# Patient Record
Sex: Female | Born: 1985 | Race: White | Hispanic: Yes | Marital: Married | Smoking: Never smoker
Health system: Southern US, Community
[De-identification: ages and names within clinical notes are randomized; demographics above are authoritative.]

## PROBLEM LIST (undated history)

## (undated) DIAGNOSIS — A64 Unspecified sexually transmitted disease: Secondary | ICD-10-CM

## (undated) DIAGNOSIS — IMO0002 Reserved for concepts with insufficient information to code with codable children: Secondary | ICD-10-CM

## (undated) DIAGNOSIS — R87629 Unspecified abnormal cytological findings in specimens from vagina: Secondary | ICD-10-CM

## (undated) HISTORY — DX: Unspecified sexually transmitted disease: A64

## (undated) HISTORY — DX: Unspecified abnormal cytological findings in specimens from vagina: R87.629

## (undated) HISTORY — DX: Reserved for concepts with insufficient information to code with codable children: IMO0002

---

## 2009-06-05 DIAGNOSIS — A64 Unspecified sexually transmitted disease: Secondary | ICD-10-CM

## 2009-06-05 HISTORY — DX: Unspecified sexually transmitted disease: A64

## 2009-07-04 DIAGNOSIS — R87619 Unspecified abnormal cytological findings in specimens from cervix uteri: Secondary | ICD-10-CM

## 2009-07-04 DIAGNOSIS — IMO0002 Reserved for concepts with insufficient information to code with codable children: Secondary | ICD-10-CM

## 2009-07-04 HISTORY — DX: Reserved for concepts with insufficient information to code with codable children: IMO0002

## 2009-07-04 HISTORY — DX: Unspecified abnormal cytological findings in specimens from cervix uteri: R87.619

## 2009-07-06 HISTORY — PX: COLPOSCOPY: SHX161

## 2012-09-10 ENCOUNTER — Encounter: Payer: Self-pay | Admitting: Nurse Practitioner

## 2012-09-10 ENCOUNTER — Ambulatory Visit (INDEPENDENT_AMBULATORY_CARE_PROVIDER_SITE_OTHER): Payer: BC Managed Care – PPO | Admitting: Nurse Practitioner

## 2012-09-10 VITALS — BP 110/70 | HR 80 | Resp 20 | Ht 64.0 in | Wt 159.0 lb

## 2012-09-10 DIAGNOSIS — Z01419 Encounter for gynecological examination (general) (routine) without abnormal findings: Secondary | ICD-10-CM

## 2012-09-10 DIAGNOSIS — Z0000 Encounter for general adult medical examination without abnormal findings: Secondary | ICD-10-CM

## 2012-09-10 LAB — POCT URINALYSIS DIPSTICK
Ketones, UA: NEGATIVE
Leukocytes, UA: NEGATIVE
Nitrite, UA: NEGATIVE
Protein, UA: NEGATIVE
Urobilinogen, UA: NEGATIVE

## 2012-09-10 LAB — HEMOGLOBIN, FINGERSTICK: Hemoglobin, fingerstick: 13.5 g/dL (ref 12.0–16.0)

## 2012-09-10 MED ORDER — LEVONORGESTREL-ETHINYL ESTRAD 0.15-30 MG-MCG PO TABS: 1.0000 | ORAL_TABLET | Freq: Every day | ORAL | Status: DC

## 2012-09-10 NOTE — Progress Notes (Signed)
Patient ID: Tonya Douglas, female   DOB: 12-07-1985, 27 y.o.   MRN: [id number] 27 y.o. G0. Single Hispanic Fe here for annual exam.  No new health problems.  Same partner for about a year.  They are going to [place] next month.  Patient's last menstrual period was 09/01/2012.          Sexually active: yes  The current method of family planning is OCP (estrogen/progesterone).    Exercising: yes  Gym/ health club routine includes cardio and light weights. Smoker:  no  Health Maintenance: Pap: 02/28/12 neg + HPV TDaP:  Less than 10 years (? 2008) Gardasil completed: 09/11/11 Labs: Hgb 13.5    U/A Neg   reports that she has never smoked. She has never used smokeless tobacco. She reports that she drinks about 1.5 ounces of alcohol per week. She reports that she does not use illicit drugs.  Past Medical History  Diagnosis Date  . 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 Abnormal Pap smear 02/2010    LSIL, COLPO Neg  . STD (sexually transmitted disease) 06/2009    + chlam   . STD (sexually transmitted disease) 03/29/11    + HSV II, + C&S vulva  . 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 No pertinent past surgical history.  Current Outpatient Prescriptions  Medication Sig Dispense Refill  . ALTAVERA 0.15-30 MG-MCG tablet       . FOLIC ACID PO Take by mouth 4 (four) times a week.      . Multiple Vitamin (MULTI-VITAMIN PO) Take by mouth 4 (four) times a week.       No current facility-administered medications for this visit.    History reviewed. No pertinent family history.  ROS:  Pertinent items are noted in HPI.  Otherwise, a comprehensive ROS was negative.  Exam:   BP 110/70  Pulse 80  Resp 20  Ht 5\' 4"  (1.626 m)  Wt 159 lb (72.122 kg)  BMI 27.28 kg/m2  LMP 09/01/2012 Height: 5\' 4"  (162.6 cm)  Ht Readings from Last 3 Encounters:  09/10/12 5\' 4"  (1.626 m)    General appearance: alert,  cooperative and appears stated age Head: Normocephalic, without obvious abnormality, atraumatic Neck: no adenopathy, supple, symmetrical, trachea midline and thyroid normal to inspection and palpation Lungs: clear to auscultation bilaterally Breasts: normal appearance, no masses or tenderness Heart: regular rate and rhythm Abdomen: soft, non-tender; no masses,  no organomegaly Extremities: extremities normal, atraumatic, no cyanosis or edema Skin: Skin color, texture, turgor normal. No rashes or lesions Lymph nodes: Cervical, supraclavicular, and axillary nodes normal. No abnormal inguinal nodes palpated Neurologic: Grossly normal   Pelvic: External genitalia:  no lesions              Urethra:  normal appearing urethra with no masses, tenderness or lesions              Bartholin's and Skene's: normal                 Vagina: normal appearing vagina with normal color and discharge, no lesions              Cervix: anteverted              Pap taken: yes Bimanual Exam:  Uterus:  normal size, contour, position, consistency, mobility, non-tender  Adnexa: no mass, fullness, tenderness               Rectovaginal: Confirms               Anus:  normal sphincter tone, no lesions  A:  Well Woman with normal exam  OCP for contraception  History of LGSIL with HR HPV and Colpo Biopsy 02/2010 & 03/2011  P:   Pap smear as per guidelines - if normal plan on repeat in 1 year  Refill OCP for 1 year  Counseled on breast self exam, adequate intake of calcium and vitamin D, diet and exercise return annually or prn  An After Visit Summary was printed and given to the patient.

## 2012-09-10 NOTE — Patient Instructions (Signed)
General topics  Next pap or exam is  due in 1 year Take a Women's multivitamin Take 1200 mg. of calcium daily - prefer dietary If any concerns in interim to call back  Breast Self-Awareness Practicing breast self-awareness may pick up problems early, prevent significant medical complications, and possibly save your life. By practicing breast self-awareness, you can become familiar with how your breasts look and feel and if your breasts are changing. This allows you to notice changes early. It can also offer you some reassurance that your breast health is good. One way to learn what is normal for your breasts and whether your breasts are changing is to do a breast self-exam. If you find a lump or something that was not present in the past, it is best to contact your caregiver right away. Other findings that should be evaluated by your caregiver include nipple discharge, especially if it is bloody; skin changes or reddening; areas where the skin seems to be pulled in (retracted); or new lumps and bumps. Breast pain is seldom associated with cancer (malignancy), but should also be evaluated by a caregiver. BREAST SELF-EXAM The best time to examine your breasts is 5 7 days after your menstrual period is over.  ExitCare Patient Information 2013 [employer].   Exercise to Stay Healthy Exercise helps you become and stay healthy. EXERCISE IDEAS AND TIPS Choose exercises that:  You enjoy.  Fit into your day. You do not need to exercise really hard to be healthy. You can do exercises at a slow or medium level and stay healthy. You can:  Stretch before and after working out.  Try yoga, Pilates, or tai chi.  Lift weights.  Walk fast, swim, jog, run, climb stairs, bicycle, dance, or rollerskate.  Take aerobic classes. Exercises that burn about 150 calories:  Running 1  miles in 15 minutes.  Playing volleyball for 45 to 60 minutes.  Washing and waxing a car for 45 to 60  minutes.  Playing touch football for 45 minutes.  Walking 1  miles in 35 minutes.  Pushing a stroller 1  miles in 30 minutes.  Playing basketball for 30 minutes.  Raking leaves for 30 minutes.  Bicycling 5 miles in 30 minutes.  Walking 2 miles in 30 minutes.  Dancing for 30 minutes.  Shoveling snow for 15 minutes.  Swimming laps for 20 minutes.  Walking up stairs for 15 minutes.  Bicycling 4 miles in 15 minutes.  Gardening for 30 to 45 minutes.  Jumping rope for 15 minutes.  Washing windows or floors for 45 to 60 minutes. Document Released: 02/24/2010 Document Revised: 04/16/2011 Document Reviewed: 02/24/2010 Exit[employer] Patient Information 2013 [employer].   Other topics ( that may be useful information):    Sexually Transmitted Disease Sexually transmitted disease (STD) refers to any infection that is passed from person to person during sexual activity. This may happen by way of saliva, semen, blood, vaginal mucus, or urine. Common STDs include:  Gonorrhea.  Chlamydia.  Syphilis.  HIV/AIDS.  Genital herpes.  Hepatitis B and C.  Trichomonas.  Human papillomavirus (HPV).  Pubic lice. CAUSES  An STD may be spread by bacteria, virus, or parasite. A person can get an STD by:  Sexual intercourse with an infected person.  Sharing sex toys with an infected person.  Sharing needles with an infected person.  Having intimate contact with the genitals, mouth, or rectal areas of an infected person. SYMPTOMS  Some people may not have any symptoms, but   they can still pass the infection to others. Different STDs have different symptoms. Symptoms include:  Painful or bloody urination.  Pain in the pelvis, abdomen, vagina, anus, throat, or eyes.  Skin rash, itching, irritation, growths, or sores (lesions). These usually occur in the genital or anal area.  Abnormal vaginal discharge.  Penile discharge in men.  Soft, flesh-colored skin growths in the  genital or anal area.  Fever.  Pain or bleeding during sexual intercourse.  Swollen glands in the groin area.  Yellow skin and eyes (jaundice). This is seen with hepatitis. DIAGNOSIS  To make a diagnosis, your caregiver may:  Take a medical history.  Perform a physical exam.  Take a specimen (culture) to be examined.  Examine a sample of discharge under a microscope.  Perform blood test TREATMENT   Chlamydia, gonorrhea, trichomonas, and syphilis can be cured with antibiotic medicine.  Genital herpes, hepatitis, and HIV can be treated, but not cured, with prescribed medicines. The medicines will lessen the symptoms.  Genital warts from HPV can be treated with medicine or by freezing, burning (electrocautery), or surgery. Warts may come back.  HPV is a virus and cannot be cured with medicine or surgery.However, abnormal areas may be followed very closely by your caregiver and may be removed from the cervix, vagina, or vulva through office procedures or surgery. If your diagnosis is confirmed, your recent sexual partners need treatment. This is true even if they are symptom-free or have a negative culture or evaluation. They should not have sex until their caregiver says it is okay. HOME CARE INSTRUCTIONS  All sexual partners should be informed, tested, and treated for all STDs.  Take your antibiotics as directed. Finish them even if you start to feel better.  Only take over-the-counter or prescription medicines for pain, discomfort, or fever as directed by your caregiver.  Rest.  Eat a balanced diet and drink enough fluids to keep your urine clear or pale yellow.  Do not have sex until treatment is completed and you have followed up with your caregiver. STDs should be checked after treatment.  Keep all follow-up appointments, Pap tests, and blood tests as directed by your caregiver.  Only use latex condoms and water-soluble lubricants during sexual activity. Do not use  petroleum jelly or oils.  Avoid alcohol and illegal drugs.  Get vaccinated for HPV and hepatitis. If you have not received these vaccines in the past, talk to your caregiver about whether one or both might be right for you.  Avoid risky sex practices that can break the skin. The only way to avoid getting an STD is to avoid all sexual activity.Latex condoms and dental dams (for oral sex) will help lessen the risk of getting an STD, but will not completely eliminate the risk. SEEK MEDICAL CARE IF:   You have a fever.  You have any new or worsening symptoms. Document Released: 04/14/2002 Document Revised: 04/16/2011 Document Reviewed: 04/21/2010 ExitCare Patient Information 2013 [employer].    Domestic Abuse You are being battered or abused if someone close to you hits, pushes, or physically hurts you in any way. You also are being abused if you are forced into activities. You are being sexually abused if you are forced to have sexual contact of any kind. You are being emotionally abused if you are made to feel worthless or if you are constantly threatened. It is important to remember that help is available. No one has the right to abuse you. PREVENTION OF FURTHER   ABUSE  Learn the warning signs of danger. This varies with situations but may include: the use of alcohol, threats, isolation from friends and family, or forced sexual contact. Leave if you feel that violence is going to occur.  If you are attacked or beaten, report it to the police so the abuse is documented. You do not have to press charges. The police can protect you while you or the attackers are leaving. Get the officer's name and badge number and a copy of the report.  Find someone you can trust and tell them what is happening to you: your caregiver, a nurse, clergy member, close friend or family member. Feeling ashamed is natural, but remember that you have done nothing wrong. No one deserves abuse. Document Released:  01/20/2000 Document Revised: 04/16/2011 Document Reviewed: 03/30/2010 ExitCare Patient Information 2013 [employer].    How Much is Too Much Alcohol? Drinking too much alcohol can cause injury, accidents, and health problems. These types of problems can include:   Car crashes.  Falls.  Family fighting (domestic violence).  Drowning.  Fights.  Injuries.  Burns.  Damage to certain organs.  Having a baby with birth defects. ONE DRINK CAN BE TOO MUCH WHEN YOU ARE:  Working.  Pregnant or breastfeeding.  Taking medicines. Ask your doctor.  Driving or planning to drive. If you or someone you know has a drinking problem, get help from a doctor.  Document Released: 11/18/2008 Document Revised: 04/16/2011 Document Reviewed: 11/18/2008 ExitCare Patient Information 2013 [employer].   Smoking Hazards Smoking cigarettes is extremely bad for your health. Tobacco smoke has over 200 known poisons in it. There are over 60 chemicals in tobacco smoke that cause cancer. Some of the chemicals found in cigarette smoke include:   Cyanide.  Benzene.  Formaldehyde.  Methanol (wood alcohol).  Acetylene (fuel used in welding torches).  Ammonia. Cigarette smoke also contains the poisonous gases nitrogen oxide and carbon monoxide.  Cigarette smokers have an increased risk of many serious medical problems and Smoking causes approximately:  90% of all lung cancer deaths in men.  80% of all lung cancer deaths in women.  90% of deaths from chronic obstructive lung disease. Compared with nonsmokers, smoking increases the risk of:  Coronary heart disease by 2 to 4 times.  Stroke by 2 to 4 times.  Men developing lung cancer by 23 times.  Women developing lung cancer by 13 times.  Dying from chronic obstructive lung diseases by 12 times.  . Smoking is the most preventable cause of death and disease in our society.  WHY IS SMOKING ADDICTIVE?  Nicotine is the chemical  agent in tobacco that is capable of causing addiction or dependence.  When you smoke and inhale, nicotine is absorbed rapidly into the bloodstream through your lungs. Nicotine absorbed through the lungs is capable of creating a powerful addiction. Both inhaled and non-inhaled nicotine may be addictive.  Addiction studies of cigarettes and spit tobacco show that addiction to nicotine occurs mainly during the teen years, when young people begin using tobacco products. WHAT ARE THE BENEFITS OF QUITTING?  There are many health benefits to quitting smoking.   Likelihood of developing cancer and heart disease decreases. Health improvements are seen almost immediately.  Blood pressure, pulse rate, and breathing patterns start returning to normal soon after quitting. QUITTING SMOKING   American Lung Association - 1-800-LUNGUSA  American Cancer Society - 1-800-ACS-2345 Document Released: 03/01/2004 Document Revised: 04/16/2011 Document Reviewed: 11/03/2008 ExitCare Patient Information 2013 [employer].   Stress Management Stress is a state of physical or mental tension that often results from changes in your life or normal routine. Some common causes of stress are:  Death of a loved one.  Injuries or severe illnesses.  Getting fired or changing jobs.  Moving into a new home. Other causes may be:  Sexual problems.  Business or financial losses.  Taking on a large debt.  Regular conflict with someone at home or at work.  Constant tiredness from lack of sleep. It is not just bad things that are stressful. It may be stressful to:  Win the lottery.  Get married.  Buy a new car. The amount of stress that can be easily tolerated varies from person to person. Changes generally cause stress, regardless of the types of change. Too much stress can affect your health. It may lead to physical or emotional problems. Too little stress (boredom) may also become stressful. SUGGESTIONS TO  REDUCE STRESS:  Talk things over with your family and friends. It often is helpful to share your concerns and worries. If you feel your problem is serious, you may want to get help from a professional counselor.  Consider your problems one at a time instead of lumping them all together. Trying to take care of everything at once may seem impossible. List all the things you need to do and then start with the most important one. Set a goal to accomplish 2 or 3 things each day. If you expect to do too many in a single day you will naturally fail, causing you to feel even more stressed.  Do not use alcohol or drugs to relieve stress. Although you may feel better for a short time, they do not remove the problems that caused the stress. They can also be habit forming.  Exercise regularly - at least 3 times per week. Physical exercise can help to relieve that "uptight" feeling and will relax you.  The shortest distance between despair and hope is often a good night's sleep.  Go to bed and get up on time allowing yourself time for appointments without being rushed.  Take a short "time-out" period from any stressful situation that occurs during the day. Close your eyes and take some deep breaths. Starting with the muscles in your face, tense them, hold it for a few seconds, then relax. Repeat this with the muscles in your neck, shoulders, hand, stomach, back and legs.  Take good care of yourself. Eat a balanced diet and get plenty of rest.  Schedule time for having fun. Take a break from your daily routine to relax. HOME CARE INSTRUCTIONS   Call if you feel overwhelmed by your problems and feel you can no longer manage them on your own.  Return immediately if you feel like hurting yourself or someone else. Document Released: 07/18/2000 Document Revised: 04/16/2011 Document Reviewed: 03/10/2007 ExitCare Patient Information 2013 [employer].   

## 2012-09-12 NOTE — Progress Notes (Signed)
Encounter reviewed by Dr. Brook Silva.  

## 2012-09-30 ENCOUNTER — Other Ambulatory Visit: Payer: Self-pay | Admitting: Nurse Practitioner

## 2013-02-02 ENCOUNTER — Encounter: Payer: Self-pay | Admitting: Nurse Practitioner

## 2013-08-31 ENCOUNTER — Other Ambulatory Visit: Payer: Self-pay

## 2013-08-31 MED ORDER — LEVONORGESTREL-ETHINYL ESTRAD 0.15-30 MG-MCG PO TABS: 1.0000 | ORAL_TABLET | Freq: Every day | ORAL | Status: DC

## 2013-08-31 NOTE — Telephone Encounter (Signed)
Last AEX: 09/10/12 Last refill: 09/10/12 #3 packs, 3 rfs Current AEX:10/27/13  Gave 1 refills to cover until AEX.

## 2013-09-10 ENCOUNTER — Ambulatory Visit: Payer: BC Managed Care – PPO | Admitting: Nurse Practitioner

## 2013-10-27 ENCOUNTER — Encounter: Payer: Self-pay | Admitting: Nurse Practitioner

## 2013-10-27 ENCOUNTER — Ambulatory Visit (INDEPENDENT_AMBULATORY_CARE_PROVIDER_SITE_OTHER): Payer: BC Managed Care – PPO | Admitting: Nurse Practitioner

## 2013-10-27 VITALS — BP 110/66 | HR 76 | Ht 64.0 in | Wt 165.0 lb

## 2013-10-27 DIAGNOSIS — Z0000 Encounter for general adult medical examination without abnormal findings: Secondary | ICD-10-CM

## 2013-10-27 DIAGNOSIS — Z01419 Encounter for gynecological examination (general) (routine) without abnormal findings: Secondary | ICD-10-CM

## 2013-10-27 DIAGNOSIS — Z113 Encounter for screening for infections with a predominantly sexual mode of transmission: Secondary | ICD-10-CM

## 2013-10-27 LAB — POCT URINALYSIS DIPSTICK
Bilirubin, UA: NEGATIVE
GLUCOSE UA: NEGATIVE
Ketones, UA: NEGATIVE
LEUKOCYTES UA: NEGATIVE
NITRITE UA: NEGATIVE
Protein, UA: NEGATIVE
UROBILINOGEN UA: NEGATIVE
pH, UA: 5

## 2013-10-27 LAB — HEMOGLOBIN, FINGERSTICK: HEMOGLOBIN, FINGERSTICK: 13.8 g/dL (ref 12.0–16.0)

## 2013-10-27 MED ORDER — LEVONORGESTREL-ETHINYL ESTRAD 0.15-30 MG-MCG PO TABS: 1.0000 | ORAL_TABLET | Freq: Every day | ORAL | Status: DC

## 2013-10-27 NOTE — Progress Notes (Signed)
Encounter reviewed by Dr. Jashad Depaula Silva.  

## 2013-10-27 NOTE — Patient Instructions (Addendum)
General topics  Next pap or exam is  due in 1 year Take a Women's multivitamin Take 1200 mg. of calcium daily - prefer dietary If any concerns in interim to call back  Breast Self-Awareness Practicing breast self-awareness may pick up problems early, prevent significant medical complications, and possibly save your life. By practicing breast self-awareness, you can become familiar with how your breasts look and feel and if your breasts are changing. This allows you to notice changes early. It can also offer you some reassurance that your breast health is good. One way to learn what is normal for your breasts and whether your breasts are changing is to do a breast self-exam. If you find a lump or something that was not present in the past, it is best to contact your caregiver right away. Other findings that should be evaluated by your caregiver include nipple discharge, especially if it is bloody; skin changes or reddening; areas where the skin seems to be pulled in (retracted); or new lumps and bumps. Breast pain is seldom associated with cancer (malignancy), but should also be evaluated by a caregiver. BREAST SELF-EXAM The best time to examine your breasts is 5 7 days after your menstrual period is over.  ExitCare Patient Information 2013 [employer].   Exercise to Stay Healthy Exercise helps you become and stay healthy. EXERCISE IDEAS AND TIPS Choose exercises that:  You enjoy.  Fit into your day. You do not need to exercise really hard to be healthy. You can do exercises at a slow or medium level and stay healthy. You can:  Stretch before and after working out.  Try yoga, Pilates, or tai chi.  Lift weights.  Walk fast, swim, jog, run, climb stairs, bicycle, dance, or rollerskate.  Take aerobic classes. Exercises that burn about 150 calories:  Running 1  miles in 15 minutes.  Playing volleyball for 45 to 60 minutes.  Washing and waxing a car for 45 to 60  minutes.  Playing touch football for 45 minutes.  Walking 1  miles in 35 minutes.  Pushing a stroller 1  miles in 30 minutes.  Playing basketball for 30 minutes.  Raking leaves for 30 minutes.  Bicycling 5 miles in 30 minutes.  Walking 2 miles in 30 minutes.  Dancing for 30 minutes.  Shoveling snow for 15 minutes.  Swimming laps for 20 minutes.  Walking up stairs for 15 minutes.  Bicycling 4 miles in 15 minutes.  Gardening for 30 to 45 minutes.  Jumping rope for 15 minutes.  Washing windows or floors for 45 to 60 minutes. Document Released: 02/24/2010 Document Revised: 04/16/2011 Document Reviewed: 02/24/2010 ExitCare Patient Information 2013 ExitCare[employer].   Other topics ( that may be useful information):    Sexually Transmitted Disease Sexually transmitted disease (STD) refers to any infection that is passed from person to person during sexual activity. This may happen by way of saliva, semen, blood, vaginal mucus, or urine. Common STDs include:  Gonorrhea.  Chlamydia.  Syphilis.  HIV/AIDS.  Genital herpes.  Hepatitis B and C.  Trichomonas.  Human papillomavirus (HPV).  Pubic lice. CAUSES  An STD may be spread by bacteria, virus, or parasite. A person can get an STD by:  Sexual intercourse with an infected person.  Sharing sex toys with an infected person.  Sharing needles with an infected person.  Having intimate contact with the genitals, mouth, or rectal areas of an infected person. SYMPTOMS  Some people may not have any symptoms, but   they can still pass the infection to others. Different STDs have different symptoms. Symptoms include:  Painful or bloody urination.  Pain in the pelvis, abdomen, vagina, anus, throat, or eyes.  Skin rash, itching, irritation, growths, or sores (lesions). These usually occur in the genital or anal area.  Abnormal vaginal discharge.  Penile discharge in men.  Soft, flesh-colored skin growths in the  genital or anal area.  Fever.  Pain or bleeding during sexual intercourse.  Swollen glands in the groin area.  Yellow skin and eyes (jaundice). This is seen with hepatitis. DIAGNOSIS  To make a diagnosis, your caregiver may:  Take a medical history.  Perform a physical exam.  Take a specimen (culture) to be examined.  Examine a sample of discharge under a microscope.  Perform blood test TREATMENT   Chlamydia, gonorrhea, trichomonas, and syphilis can be cured with antibiotic medicine.  Genital herpes, hepatitis, and HIV can be treated, but not cured, with prescribed medicines. The medicines will lessen the symptoms.  Genital warts from HPV can be treated with medicine or by freezing, burning (electrocautery), or surgery. Warts may come back.  HPV is a virus and cannot be cured with medicine or surgery.However, abnormal areas may be followed very closely by your caregiver and may be removed from the cervix, vagina, or vulva through office procedures or surgery. If your diagnosis is confirmed, your recent sexual partners need treatment. This is true even if they are symptom-free or have a negative culture or evaluation. They should not have sex until their caregiver says it is okay. HOME CARE INSTRUCTIONS  All sexual partners should be informed, tested, and treated for all STDs.  Take your antibiotics as directed. Finish them even if you start to feel better.  Only take over-the-counter or prescription medicines for pain, discomfort, or fever as directed by your caregiver.  Rest.  Eat a balanced diet and drink enough fluids to keep your urine clear or pale yellow.  Do not have sex until treatment is completed and you have followed up with your caregiver. STDs should be checked after treatment.  Keep all follow-up appointments, Pap tests, and blood tests as directed by your caregiver.  Only use latex condoms and water-soluble lubricants during sexual activity. Do not use  petroleum jelly or oils.  Avoid alcohol and illegal drugs.  Get vaccinated for HPV and hepatitis. If you have not received these vaccines in the past, talk to your caregiver about whether one or both might be right for you.  Avoid risky sex practices that can break the skin. The only way to avoid getting an STD is to avoid all sexual activity.Latex condoms and dental dams (for oral sex) will help lessen the risk of getting an STD, but will not completely eliminate the risk. SEEK MEDICAL CARE IF:   You have a fever.  You have any new or worsening symptoms. Document Released: 04/14/2002 Document Revised: 04/16/2011 Document Reviewed: 04/21/2010 [facility] Patient Information 2013 [place].    Domestic Abuse You are being battered or abused if someone close to you hits, pushes, or physically hurts you in any way. You also are being abused if you are forced into activities. You are being sexually abused if you are forced to have sexual contact of any kind. You are being emotionally abused if you are made to feel worthless or if you are constantly threatened. It is important to remember that help is available. No one has the right to abuse you. PREVENTION OF FURTHER  ABUSE  Learn the warning signs of danger. This varies with situations but may include: the use of alcohol, threats, isolation from friends and family, or forced sexual contact. Leave if you feel that violence is going to occur.  If you are attacked or beaten, report it to the police so the abuse is documented. You do not have to press charges. The police can protect you while you or the attackers are leaving. Get the officer's name and badge number and a copy of the report.  Find someone you can trust and tell them what is happening to you: your caregiver, a nurse, clergy member, close friend or family member. Feeling ashamed is natural, but remember that you have done nothing wrong. No one deserves abuse. Document Released:  01/20/2000 Document Revised: 04/16/2011 Document Reviewed: 03/30/2010 ExitCare Patient Information 2013 [employer]Care[employer].    How Much is Too Much Alcohol? Drinking too much alcohol can cause injury, accidents, and health problems. These types of problems can include:   Car crashes.  Falls.  Family fighting (domestic violence).  Drowning.  Fights.  Injuries.  Burns.  Damage to certain organs.  Having a baby with birth defects. ONE DRINK CAN BE TOO MUCH WHEN YOU ARE:  Working.  Pregnant or breastfeeding.  Taking medicines. Ask your doctor.  Driving or planning to drive. If you or someone you know has a drinking problem, get help from a doctor.  Document Released: 11/18/2008 Document Revised: 04/16/2011 Document Reviewed: 11/18/2008 ExitCare Patient Information 2013 ExitCare[employer].   Smoking Hazards Smoking cigarettes is extremely bad for your health. Tobacco smoke has over 200 known poisons in it. There are over 60 chemicals in tobacco smoke that cause cancer. Some of the chemicals found in cigarette smoke include:   Cyanide.  Benzene.  Formaldehyde.  Methanol (wood alcohol).  Acetylene (fuel used in welding torches).  Ammonia. Cigarette smoke also contains the poisonous gases nitrogen oxide and carbon monoxide.  Cigarette smokers have an increased risk of many serious medical problems and Smoking causes approximately:  90% of all lung cancer deaths in men.  80% of all lung cancer deaths in women.  90% of deaths from chronic obstructive lung disease. Compared with nonsmokers, smoking increases the risk of:  Coronary heart disease by 2 to 4 times.  Stroke by 2 to 4 times.  Men developing lung cancer by 23 times.  Women developing lung cancer by 13 times.  Dying from chronic obstructive lung diseases by 12 times.  . Smoking is the most preventable cause of death and disease in our society.  WHY IS SMOKING ADDICTIVE?  Nicotine is the chemical  agent in tobacco that is capable of causing addiction or dependence.  When you smoke and inhale, nicotine is absorbed rapidly into the bloodstream through your lungs. Nicotine absorbed through the lungs is capable of creating a powerful addiction. Both inhaled and non-inhaled nicotine may be addictive.  Addiction studies of cigarettes and spit tobacco show that addiction to nicotine occurs mainly during the teen years, when young people begin using tobacco products. WHAT ARE THE BENEFITS OF QUITTING?  There are many health benefits to quitting smoking.   Likelihood of developing cancer and heart disease decreases. Health improvements are seen almost immediately.  Blood pressure, pulse rate, and breathing patterns start returning to normal soon after quitting. QUITTING SMOKING   American Lung Association - 1-800-LUNGUSA  American Cancer Society - 1-800-ACS-2345 Document Released: 03/01/2004 Document Revised: 04/16/2011 Document Reviewed: 11/03/2008 ExitCare Patient Information 2013 ExitCare,   LLC.   Stress Management Stress is a state of physical or mental tension that often results from changes in your life or normal routine. Some common causes of stress are:  Death of a loved one.  Injuries or severe illnesses.  Getting fired or changing jobs.  Moving into a new home. Other causes may be:  Sexual problems.  Business or financial losses.  Taking on a large debt.  Regular conflict with someone at home or at work.  Constant tiredness from lack of sleep. It is not just bad things that are stressful. It may be stressful to:  Win the lottery.  Get married.  Buy a new car. The amount of stress that can be easily tolerated varies from person to person. Changes generally cause stress, regardless of the types of change. Too much stress can affect your health. It may lead to physical or emotional problems. Too little stress (boredom) may also become stressful. SUGGESTIONS TO  REDUCE STRESS:  Talk things over with your family and friends. It often is helpful to share your concerns and worries. If you feel your problem is serious, you may want to get help from a professional counselor.  Consider your problems one at a time instead of lumping them all together. Trying to take care of everything at once may seem impossible. List all the things you need to do and then start with the most important one. Set a goal to accomplish 2 or 3 things each day. If you expect to do too many in a single day you will naturally fail, causing you to feel even more stressed.  Do not use alcohol or drugs to relieve stress. Although you may feel better for a short time, they do not remove the problems that caused the stress. They can also be habit forming.  Exercise regularly - at least 3 times per week. Physical exercise can help to relieve that "uptight" feeling and will relax you.  The shortest distance between despair and hope is often a good night's sleep.  Go to bed and get up on time allowing yourself time for appointments without being rushed.  Take a short "time-out" period from any stressful situation that occurs during the day. Close your eyes and take some deep breaths. Starting with the muscles in your face, tense them, hold it for a few seconds, then relax. Repeat this with the muscles in your neck, shoulders, hand, stomach, back and legs.  Take good care of yourself. Eat a balanced diet and get plenty of rest.  Schedule time for having fun. Take a break from your daily routine to relax. HOME CARE INSTRUCTIONS   Call if you feel overwhelmed by your problems and feel you can no longer manage them on your own.  Return immediately if you feel like hurting yourself or someone else. Document Released: 07/18/2000 Document Revised: 04/16/2011 Document Reviewed: 03/10/2007 [facility] Patient Information 2013 [place].   Levonorgestrel intrauterine device (IUD) What is  this medicine? LEVONORGESTREL IUD (LEE voe nor jes trel) is a contraceptive (birth control) device. The device is placed inside the uterus by a healthcare professional. It is used to prevent pregnancy and can also be used to treat heavy bleeding that occurs during your period. Depending on the device, it can be used for 3 to 5 years. This medicine may be used for other purposes; ask your health care provider or pharmacist if you have questions. COMMON BRAND NAME(S): Delorenzo Latino, Isla Pence What should I tell my health  care provider before I take this medicine? They need to know if you have any of these conditions: -abnormal Pap smear -cancer of the breast, uterus, or cervix -diabetes -endometritis -genital or pelvic infection now or in the past -have more than one sexual partner or your partner has more than one partner -heart disease -history of an ectopic or tubal pregnancy -immune system problems -IUD in place -liver disease or tumor -problems with blood clots or take blood-thinners -use intravenous drugs -uterus of unusual shape -vaginal bleeding that has not been explained -an unusual or allergic reaction to levonorgestrel, other hormones, silicone, or polyethylene, medicines, foods, dyes, or preservatives -pregnant or trying to get pregnant -breast-feeding How should I use this medicine? This device is placed inside the uterus by a health care professional. Talk to your pediatrician regarding the use of this medicine in children. Special care may be needed. Overdosage: If you think you have taken too much of this medicine contact a poison control center or emergency room at once. NOTE: This medicine is only for you. Do not share this medicine with others. What if I miss a dose? This does not apply. What may interact with this medicine? Do not take this medicine with any of the following medications: -amprenavir -bosentan -fosamprenavir This medicine may also interact with the  following medications: -aprepitant -barbiturate medicines for inducing sleep or treating seizures -bexarotene -griseofulvin -medicines to treat seizures like carbamazepine, ethotoin, felbamate, oxcarbazepine, phenytoin, topiramate -modafinil -pioglitazone -rifabutin -rifampin -rifapentine -some medicines to treat HIV infection like atazanavir, indinavir, lopinavir, nelfinavir, tipranavir, ritonavir -St. John's wort -warfarin This list may not describe all possible interactions. Give your health care provider a list of all the medicines, herbs, non-prescription drugs, or dietary supplements you use. Also tell them if you smoke, drink alcohol, or use illegal drugs. Some items may interact with your medicine. What should I watch for while using this medicine? Visit your doctor or health care professional for regular check ups. See your doctor if you or your partner has sexual contact with others, becomes HIV positive, or gets a sexual transmitted disease. This product does not protect you against HIV infection (AIDS) or other sexually transmitted diseases. You can check the placement of the IUD yourself by reaching up to the top of your vagina with clean fingers to feel the threads. Do not pull on the threads. It is a good habit to check placement after each menstrual period. Call your doctor right away if you feel more of the IUD than just the threads or if you cannot feel the threads at all. The IUD may come out by itself. You may become pregnant if the device comes out. If you notice that the IUD has come out use a backup birth control method like condoms and call your health care provider. Using tampons will not change the position of the IUD and are okay to use during your period. What side effects may I notice from receiving this medicine? Side effects that you should report to your doctor or health care professional as soon as possible: -allergic reactions like skin rash, itching or hives,  swelling of the face, lips, or tongue -fever, flu-like symptoms -genital sores -high blood pressure -no menstrual period for 6 weeks during use -pain, swelling, warmth in the leg -pelvic pain or tenderness -severe or sudden headache -signs of pregnancy -stomach cramping -sudden shortness of breath -trouble with balance, talking, or walking -unusual vaginal bleeding, discharge -yellowing of the eyes or skin Side effects  that usually do not require medical attention (report to your doctor or health care professional if they continue or are bothersome): -acne -breast pain -change in sex drive or performance -changes in weight -cramping, dizziness, or faintness while the device is being inserted -headache -irregular menstrual bleeding within first 3 to 6 months of use -nausea This list may not describe all possible side effects. Call your doctor for medical advice about side effects. You may report side effects to FDA at 1-800-FDA-1088. Where should I keep my medicine? This does not apply. NOTE: This sheet is a summary. It may not cover all possible information. If you have questions about this medicine, talk to your doctor, pharmacist, or health care provider.  2015, Elsevier/Gold Standard. (2011-02-22 13:54:04)

## 2013-10-27 NOTE — Progress Notes (Signed)
Patient ID: Tonya Douglas, female   DOB: [birth year], 28 y.o.   MRN: [id number] 28 y.o. G0P0 Single Hispanic Fe here for annual exam.  Off OCP since April.  Menses off the pill is 3-4 days.  moderate to light.  No cramps.  Using withdrawal for birth control.  Same partner for 3 years. Will go back on OCP within the next few months.  Also will give info on [place].  Patient's last menstrual period was 10/20/2013.          Sexually active: yes  The current method of family planning is none. Exercising: yes Gym/ health club routine includes cardio and light weights 3 times per week. Smoker: no   Health Maintenance:  Pap: 09/10/12, WNL;  02/28/12 neg + HPV  TDaP: Less than 10 years (? 2008)  Gardasil completed: 09/11/11  Labs:  HB:  13.8  Urine:  Moderate RBC   reports that she has never smoked. She has never used smokeless tobacco. She reports that she drinks about 1.5 ounces of alcohol per week. She reports that she does not use illicit drugs.  Past Medical History  Diagnosis Date  . 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 Abnormal Pap smear 02/2010    LSIL, COLPO Neg  . STD (sexually transmitted disease) 06/2009    + chlam   . STD (sexually transmitted disease) 03/29/11    + HSV II, + C&S vulva  . 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 No pertinent past surgical history.  Current Outpatient Prescriptions  Medication Sig Dispense Refill  . FOLIC ACID PO Take by mouth 4 (four) times a week.      . Multiple Vitamin (MULTI-VITAMIN PO) Take by mouth 4 (four) times a week.      Marland Kitchen levonorgestrel-ethinyl estradiol (NORDETTE) 0.15-30 MG-MCG tablet Take 1 tablet by mouth daily.  3 Package  3   No current facility-administered medications for this visit.    Family History  Problem Relation Age of Onset  . Hypertension Mother   . Hypertension Father     ROS:  Pertinent items are noted in HPI.  Otherwise, a  comprehensive ROS was negative.  Exam:   BP 110/66  Pulse 76  Ht  (1.626 m)  Wt 165 lb (74.844 kg)  BMI 28.31 kg/m2  LMP 10/20/2013 Height:  (162.6 cm)  Ht Readings from Last 3 Encounters:  10/27/13  (1.626 m)  09/10/12  (1.626 m)    General appearance: alert, cooperative and appears stated age Head: Normocephalic, without obvious abnormality, atraumatic Neck: no adenopathy, supple, symmetrical, trachea midline and thyroid normal to inspection and palpation Lungs: clear to auscultation bilaterally Breasts: normal appearance, no masses or tenderness Heart: regular rate and rhythm Abdomen: soft, non-tender; no masses,  no organomegaly Extremities: extremities normal, atraumatic, no cyanosis or edema Skin: Skin color, texture, turgor normal. No rashes or lesions Lymph nodes: Cervical, supraclavicular, and axillary nodes normal. No abnormal inguinal nodes palpated Neurologic: Grossly normal   Pelvic: External genitalia:  no lesions              Urethra:  normal appearing urethra with no masses, tenderness or lesions              Bartholin's and Skene's: normal                 Vagina:  normal appearing vagina with normal color and discharge, no lesions, light bleeding              Cervix: anteverted              Pap taken: Yes.   Bimanual Exam:  Uterus:  normal size, contour, position, consistency, mobility, non-tender              Adnexa: no mass, fullness, tenderness               Rectovaginal: Confirms               Anus:  normal sphincter tone, no lesions  A:  Well Woman with normal exam  Currently Withdrawal for birth control  OCP for contraception if she decides in the future  History of LGSIL with HR HPV and Colpo Biopsy 02/2010 & 03/2011  R/O STD's - per request  P:   Reviewed health and wellness pertinent to exam  Pap smear taken today  Refill OCP Nordette for a year  Information of Skyla IUD - if she decides will call back  Follow with labs and  pap  Counseled on breast self exam, STD prevention, use and side effects of OCP's, adequate intake of calcium and vitamin D, diet and exercise return annually or prn  An After Visit Summary was printed and given to the patient.

## 2013-10-28 LAB — STD PANEL
HIV 1&2 Ab, 4th Generation: NONREACTIVE
Hepatitis B Surface Ag: NEGATIVE

## 2013-10-28 LAB — IPS PAP TEST WITH HPV

## 2013-10-28 LAB — IPS N GONORRHOEA AND CHLAMYDIA BY PCR

## 2013-11-05 ENCOUNTER — Telehealth: Payer: Self-pay | Admitting: Nurse Practitioner

## 2013-11-05 NOTE — Telephone Encounter (Signed)
GC,Chlamydia negative Pap smear negative

## 2013-11-05 NOTE — Telephone Encounter (Signed)
Verner Choleborah S. Leonard CNM please review and advise results from 9/22. Thank you.

## 2013-11-05 NOTE — Telephone Encounter (Signed)
Pt is wanting to know if her results have came in yet.

## 2013-11-06 NOTE — Telephone Encounter (Signed)
Spoke with patient. Advised of results as seen below. Patient is agreeable and verbalizes understanding.  Routing to provider for final review. Patient agreeable to disposition. Will close encounter.     

## 2014-11-01 ENCOUNTER — Ambulatory Visit: Payer: BC Managed Care – PPO | Admitting: Nurse Practitioner

## 2014-11-17 ENCOUNTER — Encounter: Payer: Self-pay | Admitting: Nurse Practitioner

## 2014-11-17 ENCOUNTER — Other Ambulatory Visit: Payer: Self-pay | Admitting: Nurse Practitioner

## 2014-11-17 ENCOUNTER — Ambulatory Visit (INDEPENDENT_AMBULATORY_CARE_PROVIDER_SITE_OTHER): Payer: 59 | Admitting: Nurse Practitioner

## 2014-11-17 VITALS — BP 110/64 | HR 64 | Ht 64.0 in | Wt 167.0 lb

## 2014-11-17 DIAGNOSIS — R319 Hematuria, unspecified: Secondary | ICD-10-CM

## 2014-11-17 DIAGNOSIS — Z113 Encounter for screening for infections with a predominantly sexual mode of transmission: Secondary | ICD-10-CM | POA: Diagnosis not present

## 2014-11-17 DIAGNOSIS — Z0000 Encounter for general adult medical examination without abnormal findings: Secondary | ICD-10-CM | POA: Diagnosis not present

## 2014-11-17 DIAGNOSIS — Z01419 Encounter for gynecological examination (general) (routine) without abnormal findings: Secondary | ICD-10-CM

## 2014-11-17 LAB — POCT URINALYSIS DIPSTICK
BILIRUBIN UA: NEGATIVE
GLUCOSE UA: NEGATIVE
Ketones, UA: NEGATIVE
Leukocytes, UA: NEGATIVE
Nitrite, UA: NEGATIVE
Protein, UA: NEGATIVE
Urobilinogen, UA: NEGATIVE
pH, UA: 5.5

## 2014-11-17 LAB — HEMOGLOBIN, FINGERSTICK: Hemoglobin, fingerstick: 13.5 g/dL (ref 12.0–16.0)

## 2014-11-17 MED ORDER — LEVONORGESTREL-ETHINYL ESTRAD 0.15-30 MG-MCG PO TABS: 1.0000 | ORAL_TABLET | Freq: Every day | ORAL | Status: DC

## 2014-11-17 NOTE — Patient Instructions (Signed)
General topics  Next pap or exam is  due in 1 year Take a Women's multivitamin Take 1200 mg. of calcium daily - prefer dietary If any concerns in interim to call back  Breast Self-Awareness Practicing breast self-awareness may pick up problems early, prevent significant medical complications, and possibly save your life. By practicing breast self-awareness, you can become familiar with how your breasts look and feel and if your breasts are changing. This allows you to notice changes early. It can also offer you some reassurance that your breast health is good. One way to learn what is normal for your breasts and whether your breasts are changing is to do a breast self-exam. If you find a lump or something that was not present in the past, it is best to contact your caregiver right away. Other findings that should be evaluated by your caregiver include nipple discharge, especially if it is bloody; skin changes or reddening; areas where the skin seems to be pulled in (retracted); or new lumps and bumps. Breast pain is seldom associated with cancer (malignancy), but should also be evaluated by a caregiver. BREAST SELF-EXAM The best time to examine your breasts is 5 7 days after your menstrual period is over.  ExitCare Patient Information 2013 [employer].   Exercise to Stay Healthy Exercise helps you become and stay healthy. EXERCISE IDEAS AND TIPS Choose exercises that:  You enjoy.  Fit into your day. You do not need to exercise really hard to be healthy. You can do exercises at a slow or medium level and stay healthy. You can:  Stretch before and after working out.  Try yoga, Pilates, or tai chi.  Lift weights.  Walk fast, swim, jog, run, climb stairs, bicycle, dance, or rollerskate.  Take aerobic classes. Exercises that burn about 150 calories:  Running 1  miles in 15 minutes.  Playing volleyball for 45 to 60 minutes.  Washing and waxing a car for 45 to 60  minutes.  Playing touch football for 45 minutes.  Walking 1  miles in 35 minutes.  Pushing a stroller 1  miles in 30 minutes.  Playing basketball for 30 minutes.  Raking leaves for 30 minutes.  Bicycling 5 miles in 30 minutes.  Walking 2 miles in 30 minutes.  Dancing for 30 minutes.  Shoveling snow for 15 minutes.  Swimming laps for 20 minutes.  Walking up stairs for 15 minutes.  Bicycling 4 miles in 15 minutes.  Gardening for 30 to 45 minutes.  Jumping rope for 15 minutes.  Washing windows or floors for 45 to 60 minutes. Document Released: 02/24/2010 Document Revised: 04/16/2011 Document Reviewed: 02/24/2010 ExitCare Patient Information 2013 [employer].   Other topics ( that may be useful information):    Sexually Transmitted Disease Sexually transmitted disease (STD) refers to any infection that is passed from person to person during sexual activity. This may happen by way of saliva, semen, blood, vaginal mucus, or urine. Common STDs include:  Gonorrhea.  Chlamydia.  Syphilis.  HIV/AIDS.  Genital herpes.  Hepatitis B and C.  Trichomonas.  Human papillomavirus (HPV).  Pubic lice. CAUSES  An STD may be spread by bacteria, virus, or parasite. A person can get an STD by:  Sexual intercourse with an infected person.  Sharing sex toys with an infected person.  Sharing needles with an infected person.  Having intimate contact with the genitals, mouth, or rectal areas of an infected person. SYMPTOMS  Some people may not have any symptoms, but   they can still pass the infection to others. Different STDs have different symptoms. Symptoms include:  Painful or bloody urination.  Pain in the pelvis, abdomen, vagina, anus, throat, or eyes.  Skin rash, itching, irritation, growths, or sores (lesions). These usually occur in the genital or anal area.  Abnormal vaginal discharge.  Penile discharge in men.  Soft, flesh-colored skin growths in the  genital or anal area.  Fever.  Pain or bleeding during sexual intercourse.  Swollen glands in the groin area.  Yellow skin and eyes (jaundice). This is seen with hepatitis. DIAGNOSIS  To make a diagnosis, your caregiver may:  Take a medical history.  Perform a physical exam.  Take a specimen (culture) to be examined.  Examine a sample of discharge under a microscope.  Perform blood test TREATMENT   Chlamydia, gonorrhea, trichomonas, and syphilis can be cured with antibiotic medicine.  Genital herpes, hepatitis, and HIV can be treated, but not cured, with prescribed medicines. The medicines will lessen the symptoms.  Genital warts from HPV can be treated with medicine or by freezing, burning (electrocautery), or surgery. Warts may come back.  HPV is a virus and cannot be cured with medicine or surgery.However, abnormal areas may be followed very closely by your caregiver and may be removed from the cervix, vagina, or vulva through office procedures or surgery. If your diagnosis is confirmed, your recent sexual partners need treatment. This is true even if they are symptom-free or have a negative culture or evaluation. They should not have sex until their caregiver says it is okay. HOME CARE INSTRUCTIONS  All sexual partners should be informed, tested, and treated for all STDs.  Take your antibiotics as directed. Finish them even if you start to feel better.  Only take over-the-counter or prescription medicines for pain, discomfort, or fever as directed by your caregiver.  Rest.  Eat a balanced diet and drink enough fluids to keep your urine clear or pale yellow.  Do not have sex until treatment is completed and you have followed up with your caregiver. STDs should be checked after treatment.  Keep all follow-up appointments, Pap tests, and blood tests as directed by your caregiver.  Only use latex condoms and water-soluble lubricants during sexual activity. Do not use  petroleum jelly or oils.  Avoid alcohol and illegal drugs.  Get vaccinated for HPV and hepatitis. If you have not received these vaccines in the past, talk to your caregiver about whether one or both might be right for you.  Avoid risky sex practices that can break the skin. The only way to avoid getting an STD is to avoid all sexual activity.Latex condoms and dental dams (for oral sex) will help lessen the risk of getting an STD, but will not completely eliminate the risk. SEEK MEDICAL CARE IF:   You have a fever.  You have any new or worsening symptoms. Document Released: 04/14/2002 Document Revised: 04/16/2011 Document Reviewed: 04/21/2010 [facility] Patient Information 2013 [employer].    Domestic Abuse You are being battered or abused if someone close to you hits, pushes, or physically hurts you in any way. You also are being abused if you are forced into activities. You are being sexually abused if you are forced to have sexual contact of any kind. You are being emotionally abused if you are made to feel worthless or if you are constantly threatened. It is important to remember that help is available. No one has the right to abuse you. PREVENTION OF FURTHER  ABUSE  Learn the warning signs of danger. This varies with situations but may include: the use of alcohol, threats, isolation from friends and family, or forced sexual contact. Leave if you feel that violence is going to occur.  If you are attacked or beaten, report it to the police so the abuse is documented. You do not have to press charges. The police can protect you while you or the attackers are leaving. Get the officer's name and badge number and a copy of the report.  Find someone you can trust and tell them what is happening to you: your caregiver, a nurse, clergy member, close friend or family member. Feeling ashamed is natural, but remember that you have done nothing wrong. No one deserves abuse. Document Released:  01/20/2000 Document Revised: 04/16/2011 Document Reviewed: 03/30/2010 ExitCare Patient Information 2013 [employer].    How Much is Too Much Alcohol? Drinking too much alcohol can cause injury, accidents, and health problems. These types of problems can include:   Car crashes.  Falls.  Family fighting (domestic violence).  Drowning.  Fights.  Injuries.  Burns.  Damage to certain organs.  Having a baby with birth defects. ONE DRINK CAN BE TOO MUCH WHEN YOU ARE:  Working.  Pregnant or breastfeeding.  Taking medicines. Ask your doctor.  Driving or planning to drive. If you or someone you know has a drinking problem, get help from a doctor.  Document Released: 11/18/2008 Document Revised: 04/16/2011 Document Reviewed: 11/18/2008 ExitCare Patient Information 2013 [employer].   Smoking Hazards Smoking cigarettes is extremely bad for your health. Tobacco smoke has over 200 known poisons in it. There are over 60 chemicals in tobacco smoke that cause cancer. Some of the chemicals found in cigarette smoke include:   Cyanide.  Benzene.  Formaldehyde.  Methanol (wood alcohol).  Acetylene (fuel used in welding torches).  Ammonia. Cigarette smoke also contains the poisonous gases nitrogen oxide and carbon monoxide.  Cigarette smokers have an increased risk of many serious medical problems and Smoking causes approximately:  90% of all lung cancer deaths in men.  80% of all lung cancer deaths in women.  90% of deaths from chronic obstructive lung disease. Compared with nonsmokers, smoking increases the risk of:  Coronary heart disease by 2 to 4 times.  Stroke by 2 to 4 times.  Men developing lung cancer by 23 times.  Women developing lung cancer by 13 times.  Dying from chronic obstructive lung diseases by 12 times.  . Smoking is the most preventable cause of death and disease in our society.  WHY IS SMOKING ADDICTIVE?  Nicotine is the chemical  agent in tobacco that is capable of causing addiction or dependence.  When you smoke and inhale, nicotine is absorbed rapidly into the bloodstream through your lungs. Nicotine absorbed through the lungs is capable of creating a powerful addiction. Both inhaled and non-inhaled nicotine may be addictive.  Addiction studies of cigarettes and spit tobacco show that addiction to nicotine occurs mainly during the teen years, when young people begin using tobacco products. WHAT ARE THE BENEFITS OF QUITTING?  There are many health benefits to quitting smoking.   Likelihood of developing cancer and heart disease decreases. Health improvements are seen almost immediately.  Blood pressure, pulse rate, and breathing patterns start returning to normal soon after quitting. QUITTING SMOKING   American Lung Association - 1-800-LUNGUSA  American Cancer Society - 1-800-ACS-2345 Document Released: 03/01/2004 Document Revised: 04/16/2011 Document Reviewed: 11/03/2008 ExitCare Patient Information 2013 ExitCare,   LLC.   Stress Management Stress is a state of physical or mental tension that often results from changes in your life or normal routine. Some common causes of stress are:  Death of a loved one.  Injuries or severe illnesses.  Getting fired or changing jobs.  Moving into a new home. Other causes may be:  Sexual problems.  Business or financial losses.  Taking on a large debt.  Regular conflict with someone at home or at work.  Constant tiredness from lack of sleep. It is not just bad things that are stressful. It may be stressful to:  Win the lottery.  Get married.  Buy a new car. The amount of stress that can be easily tolerated varies from person to person. Changes generally cause stress, regardless of the types of change. Too much stress can affect your health. It may lead to physical or emotional problems. Too little stress (boredom) may also become stressful. SUGGESTIONS TO  REDUCE STRESS:  Talk things over with your family and friends. It often is helpful to share your concerns and worries. If you feel your problem is serious, you may want to get help from a professional counselor.  Consider your problems one at a time instead of lumping them all together. Trying to take care of everything at once may seem impossible. List all the things you need to do and then start with the most important one. Set a goal to accomplish 2 or 3 things each day. If you expect to do too many in a single day you will naturally fail, causing you to feel even more stressed.  Do not use alcohol or drugs to relieve stress. Although you may feel better for a short time, they do not remove the problems that caused the stress. They can also be habit forming.  Exercise regularly - at least 3 times per week. Physical exercise can help to relieve that "uptight" feeling and will relax you.  The shortest distance between despair and hope is often a good night's sleep.  Go to bed and get up on time allowing yourself time for appointments without being rushed.  Take a short "time-out" period from any stressful situation that occurs during the day. Close your eyes and take some deep breaths. Starting with the muscles in your face, tense them, hold it for a few seconds, then relax. Repeat this with the muscles in your neck, shoulders, hand, stomach, back and legs.  Take good care of yourself. Eat a balanced diet and get plenty of rest.  Schedule time for having fun. Take a break from your daily routine to relax. HOME CARE INSTRUCTIONS   Call if you feel overwhelmed by your problems and feel you can no longer manage them on your own.  Return immediately if you feel like hurting yourself or someone else. Document Released: 07/18/2000 Document Revised: 04/16/2011 Document Reviewed: 03/10/2007 ExitCare Patient Information 2013 ExitCare, [employer].  

## 2014-11-17 NOTE — Progress Notes (Signed)
Patient ID: Tonya Douglas, female   DOB: 1985-03-16, 29 y.o.   MRN: [id number] 29 y.o. G0P0 Single  Hispanic Fe here for annual exam.  Menses now at 4-5 days.  No cramps. Ended last relationship 2 months ago.  He was not unfaithful.  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  She has noted twice that she has been bitten on the buttock in the same place.  Last episode was about 2 months ago and did seek medical care - they felt like it was a spider bite.  She is aware that the likely hood of this occurrence in the same place is low and she will let us know if or when this occurs again so that we may check her.  Patient's last menstrual period was 10/26/2014 (exact date).          Sexually active: No.  The current method of family planning is OCP (estrogen/progesterone).    Exercising: Yes.    weights and cardio 3-5 times per week Smoker:  no  Health Maintenance: Pap: 10/27/13, Negative with neg HR HPV; 09/10/12,Negative; 02/28/12, Negative with + HR HPV  TDaP: Less than 10 years (? 2008)  Gardasil completed: 09/11/11  Labs: HB: 13.5  Urine:  Large RBC   reports that she has never smoked. She has never used smokeless tobacco. She reports that she drinks alcohol. She reports that she does not use illicit drugs.  Past Medical History  Diagnosis Date  . 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 Abnormal Pap smear 02/2010    LSIL, COLPO Neg  . STD (sexually transmitted disease) 06/2009    + chlam   . STD (sexually transmitted disease) 03/29/11    + HSV II, + C&S vulva  . STD (sexually transmitted disease) 04/11/11    + RPR titer, confirmation test neg    Past Surgical History  Procedure Laterality Date  . Colposcopy  07/2009    CIN I    Current Outpatient Prescriptions  Medication Sig Dispense Refill  . Biotin 10 MG TABS Take 1 tablet by mouth daily.    Marland Kitchen FOLIC ACID PO Take by mouth 4 (four) times a week.    Marland Kitchen levonorgestrel-ethinyl estradiol  (NORDETTE) 0.15-30 MG-MCG tablet Take 1 tablet by mouth daily. 3 Package 4  . Multiple Vitamin (MULTI-VITAMIN PO) Take by mouth 4 (four) times a week.     No current facility-administered medications for this visit.    Family History  Problem Relation Age of Onset  . Hypertension Mother   . Hypertension Father     ROS:  Pertinent items are noted in HPI.  Otherwise, a comprehensive ROS was negative.  Exam:   BP 110/64 mmHg  Pulse 64  Ht  (1.626 m)  Wt 167 lb (75.751 kg)  BMI 28.65 kg/m2  LMP 10/26/2014 (Exact Date) Height:  (162.6 cm) Ht Readings from Last 3 Encounters:  11/17/14  (1.626 m)  10/27/13  (1.626 m)  09/10/12  (1.626 m)    General appearance: alert, cooperative and appears stated age Head: Normocephalic, without obvious abnormality, atraumatic Neck: no adenopathy, supple, symmetrical, trachea midline and thyroid normal to inspection and palpation Lungs: clear to auscultation bilaterally Breasts: normal appearance, no masses or tenderness Heart: regular rate and rhythm Abdomen: soft, non-tender; no masses,  no organomegaly Extremities: extremities normal, atraumatic, no cyanosis or edema Skin: Skin  color, texture, turgor normal. No rashes or lesions Lymph nodes: Cervical, supraclavicular, and axillary nodes normal. No abnormal inguinal nodes palpated Neurologic: Grossly normal   Pelvic: External genitalia:  no lesions              Urethra:  normal appearing urethra with no masses, tenderness or lesions              Bartholin's and Skene's: normal                 Vagina: normal appearing vagina with normal color and discharge, no lesions              Cervix: anteverted              Pap taken: Yes.   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

## 2014-11-18 LAB — URINE CULTURE
Colony Count: NO GROWTH
Organism ID, Bacteria: NO GROWTH

## 2014-11-18 LAB — URINALYSIS, MICROSCOPIC ONLY
BACTERIA UA: NONE SEEN [HPF]
CASTS: NONE SEEN [LPF]
CRYSTALS: NONE SEEN [HPF]
RBC / HPF: NONE SEEN RBC/HPF (ref <=2)
WBC UA: NONE SEEN WBC/HPF (ref <=5)
Yeast: NONE SEEN [HPF]

## 2014-11-18 LAB — HEPATITIS B SURFACE ANTIBODY,QUALITATIVE: Hep B S Ab: POSITIVE — AB

## 2014-11-18 LAB — HIV ANTIBODY (ROUTINE TESTING W REFLEX): HIV 1&2 Ab, 4th Generation: NONREACTIVE

## 2014-11-18 NOTE — Progress Notes (Signed)
Encounter reviewed by Dr. Janean SarkBrook Amundson C. Silva. Urine micro showed less than 2 RBCs per high power field (normal). UC pending.

## 2014-11-19 LAB — IPS PAP TEST WITH REFLEX TO HPV

## 2014-11-19 LAB — T.PALLIDUM AB, TOTAL: T pallidum Antibodies (TP-PA): NONREACTIVE

## 2014-11-20 LAB — IPS N GONORRHOEA AND CHLAMYDIA BY PCR

## 2014-11-22 LAB — HEPATITIS B SURFACE ANTIGEN: HEP B S AG: NEGATIVE

## 2015-01-17 ENCOUNTER — Other Ambulatory Visit: Payer: Self-pay | Admitting: Nurse Practitioner

## 2015-03-02 ENCOUNTER — Ambulatory Visit (INDEPENDENT_AMBULATORY_CARE_PROVIDER_SITE_OTHER): Payer: 59 | Admitting: Nurse Practitioner

## 2015-03-02 ENCOUNTER — Encounter: Payer: Self-pay | Admitting: Nurse Practitioner

## 2015-03-02 VITALS — BP 124/80 | HR 68 | Temp 98.5°F | Ht 64.0 in | Wt 161.0 lb

## 2015-03-02 DIAGNOSIS — L989 Disorder of the skin and subcutaneous tissue, unspecified: Secondary | ICD-10-CM

## 2015-03-02 MED ORDER — VALACYCLOVIR HCL 1 G PO TABS: 1000.0000 mg | ORAL_TABLET | Freq: Two times a day (BID) | ORAL | Status: DC

## 2015-03-02 NOTE — Progress Notes (Signed)
Subjective:     Patient ID: Tonya Douglas, female   DOB: [birth year], 30 y.o.   MRN: [id number]  HPI  30 yo Single Hispanic Female G0 presents with a lesion at top of buttocks since Saturday.  This is the same area that had flared up last fall that she thought was related to a bug bite at the same location.  She did seek medical care at that time and was told that it was a spider bite.  At [facility] with the scar still there, she was instructed to call with next outbreak that we could confirm if HSV related.  She has been diagnosed with HSV II with culture proven 03/29/11.  Currently has no lesions vulvar area.  She has not been dating or SA since ending her last relationship because he was unfaithful.  She has not ever taken Valtrex in the past for HSV.   Review of Systems  Constitutional: Negative for fever, chills, appetite change, fatigue and unexpected weight change.  HENT: Negative.   Respiratory: Negative.   Cardiovascular: Negative.   Gastrointestinal: Negative.   Genitourinary: Negative.        No increase or change in vaginal discharge.  Musculoskeletal: Negative.   Skin: Positive for color change and rash.       Cluster of rash top of buttock right at midline.  Neurological: Negative.   Psychiatric/Behavioral: Negative.        Objective:   Physical Exam  Constitutional: She is oriented to person, place, and time. She appears well-developed and well-nourished. No distress.  Abdominal: Soft.  Genitourinary:  There is a cluster of HSV lesions top of buttocks midline.  This cluster consist of 4 lesions that are vesicular and various stages of drying.  Area is opened and HSV culture is obtained. Areas are classic for HSV.  Neurological: She is alert and oriented to person, place, and time.  Skin: Skin is warm and dry.  Psychiatric: She has a normal mood and affect. Her behavior is normal. Judgment and thought content normal.       Assessment:     Lesions top of buttock - most  consistent with HSV.    Plan:     Will give her Valtrex 1 GM bid - she prefers to wait and not take right away but likes the option of considering.  She is aware not to be SA with either outbreak at buttock and labia if there is lesions. Will follow with HSV culture.

## 2015-03-02 NOTE — Patient Instructions (Signed)
We will call with test results.

## 2015-03-04 LAB — HERPES SIMPLEX VIRUS CULTURE: Organism ID, Bacteria: DETECTED

## 2015-03-06 NOTE — Progress Notes (Signed)
Encounter reviewed by Dr. Brook Amundson C. Silva.  

## 2015-03-08 ENCOUNTER — Telehealth: Payer: Self-pay

## 2015-03-08 NOTE — Telephone Encounter (Signed)
Spoke with patient. Advised of message as seen below from Patricia Grubb, FNP. She is agreeable and verbalizes understanding.  Routing to provider for final review. Patient agreeable to disposition. Will close encounter.  

## 2015-03-08 NOTE — Telephone Encounter (Signed)
-----   Message from Ria Comment, FNP sent at 03/06/2015  7:35 PM EST ----- Please let patient know that lesion on lower back area was HSV II - she has already been diagnosed with genital HSV II.  So she has a treatment plan for a flare with using Valtrex.

## 2015-04-21 ENCOUNTER — Telehealth: Payer: Self-pay | Admitting: Nurse Practitioner

## 2015-04-21 ENCOUNTER — Other Ambulatory Visit: Payer: Self-pay | Admitting: Nurse Practitioner

## 2015-04-21 NOTE — Telephone Encounter (Signed)
Patient would like to know if she can come in to get the Hep A or typhoid vaccination because [place]

## 2015-04-21 NOTE — Telephone Encounter (Signed)
Spoke with patient. Advised we are unable to administer Hep A or Typhoid vaccination. She will need to see PCP or Urgent Care for needed vaccinations. Provided information to [facility] off of [address] [phone]. She is agreeable and will contact their office to schedule an appointment. Patient would like to be seen in office for STD testing with Ria CommentPatricia Grubb, FNP. Appointment scheduled for 04/22/2015 at 12:45 pm with Ria CommentPatricia Grubb, FNP. She is agreeable to date and time.  Routing to provider for final review. Patient agreeable to disposition. Will close encounter.

## 2015-04-22 ENCOUNTER — Ambulatory Visit: Payer: 59 | Admitting: Nurse Practitioner

## 2015-04-22 ENCOUNTER — Ambulatory Visit (INDEPENDENT_AMBULATORY_CARE_PROVIDER_SITE_OTHER): Payer: 59 | Admitting: Obstetrics and Gynecology

## 2015-04-22 ENCOUNTER — Encounter: Payer: Self-pay | Admitting: Obstetrics and Gynecology

## 2015-04-22 VITALS — BP 102/72 | HR 90 | Ht 64.0 in | Wt 158.4 lb

## 2015-04-22 DIAGNOSIS — Z113 Encounter for screening for infections with a predominantly sexual mode of transmission: Secondary | ICD-10-CM | POA: Diagnosis not present

## 2015-04-22 LAB — HEPATITIS C ANTIBODY: HCV Ab: NEGATIVE

## 2015-04-22 MED ORDER — LEVONORGESTREL-ETHINYL ESTRAD 0.15-30 MG-MCG PO TABS: 1.0000 | ORAL_TABLET | Freq: Every day | ORAL | Status: DC

## 2015-04-22 NOTE — Progress Notes (Signed)
Patient ID: Trenae Brunke, female   DOB: [birth year], 30 y.o.   MRN: [id number] GYNECOLOGY  VISIT   HPI: 30 y.o.   Single  Hispanic  female   G0P0 with Patient's last menstrual period was 03/30/2015 (approximate).   here for STD testing.  Recently sexually active.  New partner and did not use protection.   Hx of HSV II and chlamydia.  Thinks she may be starting an outbreak.  Needs refill of antiviral. Rare outbreaks.   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   GYNECOLOGIC HISTORY: Patient's last menstrual period was 03/30/2015 (approximate). Contraception:None Menopausal hormone therapy: n/a Last mammogram: n/a Last pap smear: 11-17-14 Negative        OB History    Gravida Para Term Preterm AB TAB SAB Ectopic Multiple Living   0 0                 There are no active problems to display for this patient.   Past Medical History  Diagnosis Date  . 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 Abnormal Pap smear 02/2010    LSIL, COLPO Neg  . STD (sexually transmitted disease) 06/2009    + chlam   . STD (sexually transmitted disease) 03/29/11    + HSV II proven C&S vulva  . STD (sexually transmitted disease) 04/11/11    + RPR titer, confirmation test neg do T.pallidium test (lab 11914)    Past Surgical History  Procedure Laterality Date  . Colposcopy  07/2009    CIN I    Current Outpatient Prescriptions  Medication Sig Dispense Refill  . Biotin 10 MG TABS Take 1 tablet by mouth daily.    . DENTA 5000 PLUS 1.1 % CREA dental cream See admin instructions.  4  . FOLIC ACID PO Take by mouth 4 (four) times a week.    . Multiple Vitamin (MULTI-VITAMIN PO) Take by mouth 4 (four) times a week.    . valACYclovir (VALTREX) 1000 MG tablet TAKE 1 TABLET (1,000 MG TOTAL) BY MOUTH 2 (TWO) TIMES DAILY. TAKE FOR 10 DAYS 30 tablet 3   No current facility-administered medications for this visit.     ALLERGIES: Review of patient's allergies indicates no known allergies.  Family History   Problem Relation Age of Onset  . Hypertension Mother   . Hypertension Father     Social History   Social History  . Marital Status: Single    Spouse Name: N/A  . Number of Children: N/A  . Years of Education: N/A   Occupational History  . Not on file.   Social History Main Topics  . Smoking status: Never Smoker   . Smokeless tobacco: Never Used  . Alcohol Use: 0.0 oz/week    0 Standard drinks or equivalent per week  . Drug Use: No  . Sexual Activity:    Partners: Male    Birth Control/ Protection: None   Other Topics Concern  . Not on file   Social History Narrative    ROS:  Pertinent items are noted in HPI.  PHYSICAL EXAMINATION:    BP 102/72 mmHg  Pulse 90  Ht  (1.626 m)  Wt 158 lb 6.4 oz (71.85 kg)  BMI 27.18 kg/m2  LMP 03/30/2015 (Approximate)    General appearance: alert, cooperative and appears stated age   Pelvic: External genitalia:  no lesions              Urethra:  normal  appearing urethra with no masses, tenderness or lesions              Bartholins and Skenes: normal                 Vagina: normal appearing vagina with normal color and discharge, no lesions, white slightly thick discharge.              Cervix: no lesions             Bimanual Exam:  Uterus:  normal size, contour, position, consistency, mobility, non-tender              Adnexa: normal adnexa and no mass, fullness, tenderness                          Anus:   Hair follicle at 8:00 outside anal opening.   Chaperone was present for exam.  ASSESSMENT  Desire for STD testing.  Hx HSV.  Hx prior chlamydia.   PLAN  Counseled regarding STD screening.  HIV, RPR, Hep B and C, GC/CT, Affirm.  Has refills already for Valtrex.  Sent in today by Patty.  Rx for Nordette, 3 packs and 3 refills.  Used this is past without problems.  Annual exam with Shirlyn GoltzPatty Grubb in October.    An After Visit Summary was printed and given to the patient.  _15_____ minutes face to face time of  which over 50% was spent in counseling.

## 2015-04-22 NOTE — Telephone Encounter (Signed)
Medication refill request: Valtrex 1000 mg Last AEX:  11/17/14 with PG Next AEX: 11/22/15 with PG Last MMG (if hormonal medication request): n/a Refill authorized: #20?

## 2015-04-23 LAB — STD PANEL
HIV 1&2 Ab, 4th Generation: NONREACTIVE
Hepatitis B Surface Ag: NEGATIVE

## 2015-04-23 LAB — WET PREP BY MOLECULAR PROBE
Candida species: POSITIVE — AB
GARDNERELLA VAGINALIS: NEGATIVE
TRICHOMONAS VAG: NEGATIVE

## 2015-04-23 LAB — GC/CHLAMYDIA PROBE AMP
CT PROBE, AMP APTIMA: NOT DETECTED
GC PROBE AMP APTIMA: NOT DETECTED

## 2015-06-13 ENCOUNTER — Encounter: Payer: Self-pay | Admitting: Nurse Practitioner

## 2015-06-13 ENCOUNTER — Ambulatory Visit (INDEPENDENT_AMBULATORY_CARE_PROVIDER_SITE_OTHER): Payer: 59 | Admitting: Nurse Practitioner

## 2015-06-13 VITALS — BP 116/74 | HR 88 | Ht 64.0 in | Wt 159.0 lb

## 2015-06-13 DIAGNOSIS — Z7251 High risk heterosexual behavior: Secondary | ICD-10-CM

## 2015-06-13 DIAGNOSIS — Z113 Encounter for screening for infections with a predominantly sexual mode of transmission: Secondary | ICD-10-CM | POA: Diagnosis not present

## 2015-06-13 LAB — POCT URINE PREGNANCY: PREG TEST UR: NEGATIVE

## 2015-06-13 NOTE — Patient Instructions (Signed)
Sexually Transmitted Disease °A sexually transmitted disease (STD) is a disease or infection that may be passed (transmitted) from person to person, usually during sexual activity. This may happen by way of saliva, semen, blood, vaginal mucus, or urine. Common STDs include: °· Gonorrhea. °· Chlamydia. °· Syphilis. °· HIV and AIDS. °· Genital herpes. °· Hepatitis B and C. °· Trichomonas. °· Human papillomavirus (HPV). °· Pubic lice. °· Scabies. °· Mites. °· Bacterial vaginosis. °WHAT ARE CAUSES OF STDs? °An STD may be caused by bacteria, a virus, or parasites. STDs are often transmitted during sexual activity if one person is infected. However, they may also be transmitted through nonsexual means. STDs may be transmitted after:  °· Sexual intercourse with an infected person. °· Sharing sex toys with an infected person. °· Sharing needles with an infected person or using unclean piercing or tattoo needles. °· Having intimate contact with the genitals, mouth, or rectal areas of an infected person. °· Exposure to infected fluids during birth. °WHAT ARE THE SIGNS AND SYMPTOMS OF STDs? °Different STDs have different symptoms. Some people may not have any symptoms. If symptoms are present, they may include: °· Painful or bloody urination. °· Pain in the pelvis, abdomen, vagina, anus, throat, or eyes. °· A skin rash, itching, or irritation. °· Growths, ulcerations, blisters, or sores in the genital and anal areas. °· Abnormal vaginal discharge with or without bad odor. °· Penile discharge in men. °· Fever. °· Pain or bleeding during sexual intercourse. °· Swollen glands in the groin area. °· Yellow skin and eyes (jaundice). This is seen with hepatitis. °· Swollen testicles. °· Infertility. °· Sores and blisters in the mouth. °HOW ARE STDs DIAGNOSED? °To make a diagnosis, your health care provider may: °· Take a medical history. °· Perform a physical exam. °· Take a sample of any discharge to examine. °· Swab the throat,  cervix, opening to the penis, rectum, or vagina for testing. °· Test a sample of your first morning urine. °· Perform blood tests. °· Perform a Pap test, if this applies. °· Perform a colposcopy. °· Perform a laparoscopy. °HOW ARE STDs TREATED? °Treatment depends on the STD. Some STDs may be treated but not cured. °· Chlamydia, gonorrhea, trichomonas, and syphilis can be cured with antibiotic medicine. °· Genital herpes, hepatitis, and HIV can be treated, but not cured, with prescribed medicines. The medicines lessen symptoms. °· Genital warts from HPV can be treated with medicine or by freezing, burning (electrocautery), or surgery. Warts may come back. °· HPV cannot be cured with medicine or surgery. However, abnormal areas may be removed from the cervix, vagina, or vulva. °· If your diagnosis is confirmed, your recent sexual partners need treatment. This is true even if they are symptom-free or have a negative culture or evaluation. They should not have sex until their health care providers say it is okay. °· Your health care provider may test you for infection again 3 months after treatment. °HOW CAN I REDUCE MY RISK OF GETTING AN STD? °Take these steps to reduce your risk of getting an STD: °· Use latex condoms, dental dams, and water-soluble lubricants during sexual activity. Do not use petroleum jelly or oils. °· Avoid having multiple sex partners. °· Do not have sex with someone who has other sex partners °· Do not have sex with anyone you do not know or who is at high risk for an STD. °· Avoid risky sex practices that can break your skin. °· Do not have sex   if you have open sores on your mouth or skin. °· Avoid drinking too much alcohol or taking illegal drugs. Alcohol and drugs can affect your judgment and put you in a vulnerable position. °· Avoid engaging in oral and anal sex acts. °· Get vaccinated for HPV and hepatitis. If you have not received these vaccines in the past, talk to your health care  provider about whether one or both might be right for you. °· If you are at risk of being infected with HIV, it is recommended that you take a prescription medicine daily to prevent HIV infection. This is called pre-exposure prophylaxis (PrEP). You are considered at risk if: °¨ You are a man who has sex with other men (MSM). °¨ You are a heterosexual man or woman and are sexually active with more than one partner. °¨ You take drugs by injection. °¨ You are sexually active with a partner who has HIV. °· Talk with your health care provider about whether you are at high risk of being infected with HIV. If you choose to begin PrEP, you should first be tested for HIV. You should then be tested every 3 months for as long as you are taking PrEP. °WHAT SHOULD I DO IF I THINK I HAVE AN STD? °· See your health care provider. °· Tell your sexual partner(s). They should be tested and treated for any STDs. °· Do not have sex until your health care provider says it is okay. °WHEN SHOULD I GET IMMEDIATE MEDICAL CARE? °Contact your health care provider right away if:  °· You have severe abdominal pain. °· You are a man and notice swelling or pain in your testicles. °· You are a woman and notice swelling or pain in your vagina. °  °This information is not intended to replace advice given to you by your health care provider. Make sure you discuss any questions you have with your health care provider. °  °Document Released: 04/14/2002 Document Revised: 02/12/2014 Document Reviewed: 08/12/2012 °Elsevier Interactive Patient Education ©2016 Elsevier Inc. ° °

## 2015-06-13 NOTE — Progress Notes (Signed)
  29 yrs Single Hispanic female G0P0 here for questionable STD exposure. No change on partner but had unprotected SA a week ago.    Patient's last menstrual period was 05/30/2015 (exact date).    Contraception:The current method of family planning is condoms except on this last sexual occurrence.  Took a morning after pill on 06/08/15 with no withdrawal bleed.  She denies abnormal vaginal bleeding but did have some increase in discharge.  She thought this maybe was due to yeast infection so used 3 day Monistat.    She denies unusual pelvic pain, no dysuria, frequency or hematuria.  Denies new personal products.                                           General:Healthy female WDWN Affect: normal, orientation X 3  Abdomen: soft non-tender Lymph groin: no enlargement   Pelvic Exam:EGBUS normal. Vulva reveals no erythema, lesions or atrophic changes. Vagina normal, no lesions, polyps or discharge.  Cervix is normal, smooth pink mucosa, no friability, non tender, no CMT, no lesions, no polyps.  Uterus normal:  Adnexa: not tender Perineal area: no lesions noted  Specimens collected for Affirm, GC / Chl; STD panel                   Assessment: Normal Pelvic Exam                                     Contraception: condoms most of time STD Screening History of HSV II culture proven with oc. flare     Plan :Reviewed testing for  GC,Chl,HIV; RPR, Affirm         STD prevention reviewed.  Questions addressed.           Instructions given to patient             RV

## 2015-06-14 LAB — WET PREP BY MOLECULAR PROBE
Candida species: NEGATIVE
Gardnerella vaginalis: NEGATIVE
Trichomonas vaginosis: NEGATIVE

## 2015-06-14 LAB — STD PANEL
HEP B S AG: NEGATIVE
HIV: NONREACTIVE

## 2015-06-15 LAB — IPS N GONORRHOEA AND CHLAMYDIA BY PCR

## 2015-06-16 NOTE — Progress Notes (Signed)
Encounter reviewed by Dr. Brook Amundson C. Silva.  

## 2015-06-21 ENCOUNTER — Telehealth: Payer: Self-pay | Admitting: Emergency Medicine

## 2015-06-21 NOTE — Telephone Encounter (Signed)
Call to patient and she is given results of negative GC and chlamydia testing. Verbalizes understanding and will follow up as scheduled or as needed.  Routing to provider for final review. Patient agreeable to disposition. Will close encounter.

## 2015-06-21 NOTE — Telephone Encounter (Signed)
-----   Message from Ria CommentPatricia Grubb, FNP sent at 06/15/2015  4:59 PM EDT ----- Please let pt know that GC and Chl is negative.

## 2015-11-22 ENCOUNTER — Ambulatory Visit: Payer: 59 | Admitting: Nurse Practitioner

## 2015-11-23 ENCOUNTER — Encounter: Payer: Self-pay | Admitting: Nurse Practitioner

## 2015-11-23 ENCOUNTER — Ambulatory Visit (INDEPENDENT_AMBULATORY_CARE_PROVIDER_SITE_OTHER): Payer: 59 | Admitting: Nurse Practitioner

## 2015-11-23 VITALS — BP 120/74 | HR 72 | Ht 64.0 in | Wt 155.0 lb

## 2015-11-23 DIAGNOSIS — Z0000 Encounter for general adult medical examination without abnormal findings: Secondary | ICD-10-CM | POA: Diagnosis not present

## 2015-11-23 DIAGNOSIS — Z113 Encounter for screening for infections with a predominantly sexual mode of transmission: Secondary | ICD-10-CM | POA: Diagnosis not present

## 2015-11-23 DIAGNOSIS — Z01419 Encounter for gynecological examination (general) (routine) without abnormal findings: Secondary | ICD-10-CM | POA: Diagnosis not present

## 2015-11-23 DIAGNOSIS — R8290 Unspecified abnormal findings in urine: Secondary | ICD-10-CM

## 2015-11-23 LAB — POCT URINALYSIS DIPSTICK
Bilirubin, UA: NEGATIVE
Glucose, UA: NEGATIVE
Ketones, UA: NEGATIVE
Leukocytes, UA: NEGATIVE
Nitrite, UA: NEGATIVE
PROTEIN UA: NEGATIVE
UROBILINOGEN UA: NEGATIVE
pH, UA: 5.5

## 2015-11-23 MED ORDER — LEVONORGESTREL-ETHINYL ESTRAD 0.15-30 MG-MCG PO TABS: 1.0000 | ORAL_TABLET | Freq: Every day | ORAL | 3 refills | Status: DC

## 2015-11-23 NOTE — Patient Instructions (Signed)

## 2015-11-23 NOTE — Progress Notes (Signed)
Patient ID: Tonya Douglas, female   DOB: 26-Dec-1985, 30 y.o.   MRN: [id number]  30 y.o. G0P0000 Single  Hispanic Fe here for annual exam.  Not currently SA.  Ended last relationship about 4-5 months ago.  Menses is regular and short 3-4 days.  No cramps and no PMS.  Currently off OCP but may want to restart later.  Patient's last menstrual period was 11/10/2015 (exact date).          Sexually active: Yes.   Not currently sexually active. The current method of family planning is OCP (estrogen/progesterone) and abstinence.    Exercising: Yes.    weights and cardio 2-3 times per week Smoker:  no  Health Maintenance: Pap: 11/17/14, Negative; 10/27/13, Negative with neg HR HPV; 09/10/12,Negative; 02/28/12, Negative with + HR HPV  TDaP: 08/11/15 (Care Everywhere) Gardasil completed: 09/11/11 HIV: 06/13/15 Labs: HB: 14.3   Urine: Large RBC   reports that she has never smoked. She has never used smokeless tobacco. She reports that she drinks alcohol. She reports that she does not use drugs.  Past Medical History:  Diagnosis Date  . 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 Abnormal Pap smear 02/2010   LSIL, COLPO Neg  . STD (sexually transmitted disease) 06/2009   + chlam   . STD (sexually transmitted disease) 03/29/11   + HSV II proven C&S vulva  . 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 COLPOSCOPY  07/2009   CIN I    Current Outpatient Prescriptions  Medication Sig Dispense Refill  . Biotin 10 MG TABS Take 1 tablet by mouth daily.    . DENTA 5000 PLUS 1.1 % CREA dental cream See admin instructions.  4  . FOLIC ACID PO Take by mouth 4 (four) times a week.    Marland Kitchen. levonorgestrel-ethinyl estradiol (NORDETTE) 0.15-30 MG-MCG tablet Take 1 tablet by mouth daily. 3 Package 2  . Multiple Vitamin (MULTI-VITAMIN PO) Take by mouth 4 (four) times a week.    .  valACYclovir (VALTREX) 1000 MG tablet TAKE 1 TABLET (1,000 MG TOTAL) BY MOUTH 2 (TWO) TIMES DAILY. TAKE FOR 10 DAYS 30 tablet 3   No current facility-administered medications for this visit.     Family History  Problem Relation Age of Onset  . Hypertension Mother   . Hypertension Father     ROS:  Pertinent items are noted in HPI.  Otherwise, a comprehensive ROS was negative.  Exam:   BP 120/74 (BP Location: Right Arm, Patient Position: Sitting, Cuff Size: Normal)   Pulse 72   Ht 5\' 4"  (1.626 m)   Wt 155 lb (70.3 kg)   LMP 11/10/2015 (Exact Date)   BMI 26.61 kg/m  Height: 5\' 4"  (162.6 cm) Ht Readings from Last 3 Encounters:  11/23/15 5\' 4"  (1.626 m)  06/13/15 5\' 4"  (1.626 m)  04/22/15 5\' 4"  (1.626 m)    General appearance: alert, cooperative and appears stated age Head: Normocephalic, without obvious abnormality, atraumatic Neck: no adenopathy, supple, symmetrical, trachea midline and thyroid normal to inspection and palpation Lungs: clear to auscultation bilaterally Breasts: normal appearance, no masses or tenderness Heart: regular rate and rhythm Abdomen: soft, non-tender; no masses,  no organomegaly Extremities: extremities normal, atraumatic, no cyanosis or edema Skin: Skin color, texture, turgor normal. No rashes or lesions Lymph nodes: Cervical, supraclavicular,  and axillary nodes normal. No abnormal inguinal nodes palpated Neurologic: Grossly normal   Pelvic: External genitalia:  no lesions              Urethra:  normal appearing urethra with no masses, tenderness or lesions              Bartholin's and Skene's: normal                 Vagina: normal appearing vagina with normal color and discharge, no lesions              Cervix: anteverted              Pap taken: No. Bimanual Exam:  Uterus:  normal size, contour, position, consistency, mobility, non-tender              Adnexa: no mass, fullness, tenderness               Rectovaginal: Confirms                Anus:  normal sphincter tone, no lesions  Chaperone present: yes  A:  Well Woman with normal exam  OCP for contraception  History of LGSIL with HR HPV and Colpo Biopsy 07/2009; 02/2010 & 03/2011, normal since R/O STD's              History of HSV II proven culture             History of + RPR with T. Pallidum test as negative    P:   Reviewed health and wellness pertinent to exam  Pap smear as above  Refill on OCP for a year in case she decides to restart  Counseled on breast self exam, STD prevention, HIV risk factors and prevention, use and side effects of OCP's, adequate intake of calcium and vitamin D, diet and exercise return annually or prn  An After Visit Summary was printed and given to the patient.

## 2015-11-24 LAB — URINALYSIS, MICROSCOPIC ONLY
Bacteria, UA: NONE SEEN [HPF]
CASTS: NONE SEEN [LPF]
CRYSTALS: NONE SEEN [HPF]
Squamous Epithelial / LPF: NONE SEEN [HPF] (ref <=5)
WBC, UA: NONE SEEN WBC/HPF (ref <=5)
YEAST: NONE SEEN [HPF]

## 2015-11-24 LAB — GC/CHLAMYDIA PROBE AMP
CT PROBE, AMP APTIMA: NOT DETECTED
GC Probe RNA: NOT DETECTED

## 2015-11-24 LAB — HSV(HERPES SIMPLEX VRS) I + II AB-IGG: HSV 2 GLYCOPROTEIN G AB, IGG: 6.62 {index} — AB (ref <0.90)

## 2015-11-24 LAB — STD PANEL
HEP B S AG: NEGATIVE
HIV: NONREACTIVE

## 2015-11-25 LAB — HEMOGLOBIN, FINGERSTICK: Hemoglobin, fingerstick: 14.3 g/dL (ref 12.0–16.0)

## 2015-11-26 LAB — HSV 1/2 AB (IGM), IFA W/RFLX TITER
HSV 1 IGM SCREEN: NEGATIVE
HSV 2 IgM Screen: NEGATIVE

## 2015-11-27 NOTE — Progress Notes (Signed)
Encounter reviewed by Dr. Janean SarkBrook Amundson C. Silva. Urine micro ordered due to microscopic hematuria. STD screening performed.

## 2016-01-16 ENCOUNTER — Ambulatory Visit: Payer: 59 | Admitting: Nurse Practitioner

## 2016-01-16 ENCOUNTER — Ambulatory Visit (INDEPENDENT_AMBULATORY_CARE_PROVIDER_SITE_OTHER): Payer: 59 | Admitting: Nurse Practitioner

## 2016-01-16 ENCOUNTER — Encounter: Payer: Self-pay | Admitting: Nurse Practitioner

## 2016-01-16 VITALS — BP 112/66 | HR 76 | Ht 64.0 in | Wt 152.0 lb

## 2016-01-16 DIAGNOSIS — Z113 Encounter for screening for infections with a predominantly sexual mode of transmission: Secondary | ICD-10-CM

## 2016-01-16 DIAGNOSIS — N760 Acute vaginitis: Secondary | ICD-10-CM | POA: Diagnosis not present

## 2016-01-16 MED ORDER — VALACYCLOVIR HCL 1 G PO TABS: ORAL_TABLET | ORAL | 3 refills | Status: DC

## 2016-01-16 NOTE — Progress Notes (Signed)
30 y.o. Single Hispanic female G0P0000 here for follow up of possible exposure to STD's.  Recent change is partner X 2 tines with unprotected X 1.  No known exposure and partner with no symptoms.  No current symptoms but concerns about recurrent yeast.  LMP :  01/08/16.  ON OCP for contraception.   O: Healthy WD,WN female Affect: normal Abdomen:soft, non tender, normal bowel sounds Pelvic exam:EXTERNAL GENITALIA: normal appearing vulva with no masses, tenderness or lesions VAGINA: no abnormal discharge or lesions CERVIX: no lesions or cervical motion tenderness UTERUS: non tender  A: R/O STD's  R/O vaginitis    P: Discussed STD testing and vaginal health care - no thongs   Labs:  STD panel, GC/CHl, wet prep     RV

## 2016-01-16 NOTE — Patient Instructions (Signed)
Sexually Transmitted Disease A sexually transmitted disease (STD) is a disease or infection often passed to another person during sex. However, STDs can be passed through nonsexual ways. An STD can be passed through:  Spit (saliva).  Semen.  Blood.  Mucus from the vagina.  Pee (urine). How can I lessen my chances of getting an STD?  Use:  Latex condoms.  Water-soluble lubricants with condoms. Do not use petroleum jelly or oils.  Dental dams. These are small pieces of latex that are used as a barrier during oral sex.  Avoid having more than one sex partner.  Do not have sex with someone who has other sex partners.  Do not have sex with anyone you do not know or who is at high risk for an STD.  Avoid risky sex that can break your skin.  Do not have sex if you have open sores on your mouth or skin.  Avoid drinking too much alcohol or taking illegal drugs. Alcohol and drugs can affect your good judgment.  Avoid oral and anal sex acts.  Get shots (vaccines) for HPV and hepatitis.  If you are at risk of being infected with HIV, it is advised that you take a certain medicine daily to prevent HIV infection. This is called pre-exposure prophylaxis (PrEP). You may be at risk if:  You are a man who has sex with other men (MSM).  You are attracted to the opposite sex (heterosexual) and are having sex with more than one partner.  You take drugs with a needle.  You have sex with someone who has HIV.  Talk with your doctor about if you are at high risk of being infected with HIV. If you begin to take PrEP, get tested for HIV first. Get tested every 3 months for as long as you are taking PrEP.  Get tested for STDs every year if you are sexually active. If you are treated for an STD, get tested again 3 months after you are treated. What should I do if I think I have an STD?  See your doctor.  Tell your sex partner(s) that you have an STD. They should be tested and treated.  Do  not have sex until your doctor says it is okay. When should I get help? Get help right away if:  You have bad belly (abdominal) pain.  You are a man and have puffiness (swelling) or pain in your testicles.  You are a woman and have puffiness in your vagina. This information is not intended to replace advice given to you by your health care provider. Make sure you discuss any questions you have with your health care provider. Document Released: 03/01/2004 Document Revised: 06/30/2015 Document Reviewed: 07/18/2012 Elsevier Interactive Patient Education  2017 Elsevier Inc.  

## 2016-01-17 LAB — STD PANEL
HIV 1&2 Ab, 4th Generation: NONREACTIVE
Hepatitis B Surface Ag: NEGATIVE

## 2016-01-17 LAB — GC/CHLAMYDIA PROBE AMP
CT Probe RNA: NOT DETECTED
GC Probe RNA: NOT DETECTED

## 2016-01-17 LAB — WET PREP BY MOLECULAR PROBE
Candida species: NEGATIVE
Gardnerella vaginalis: NEGATIVE
Trichomonas vaginosis: NEGATIVE

## 2016-01-19 NOTE — Progress Notes (Signed)
Encounter reviewed by Dr. Zamya Culhane Amundson C. Silva.  

## 2016-03-05 ENCOUNTER — Emergency Department (HOSPITAL_BASED_OUTPATIENT_CLINIC_OR_DEPARTMENT_OTHER): Payer: 59

## 2016-03-05 ENCOUNTER — Emergency Department (HOSPITAL_BASED_OUTPATIENT_CLINIC_OR_DEPARTMENT_OTHER)
Admission: EM | Admit: 2016-03-05 | Discharge: 2016-03-05 | Disposition: A | Discharge status: Home / Self Care | Payer: 59 | Attending: Emergency Medicine | Admitting: Emergency Medicine

## 2016-03-05 ENCOUNTER — Encounter (HOSPITAL_BASED_OUTPATIENT_CLINIC_OR_DEPARTMENT_OTHER): Payer: Self-pay | Admitting: Emergency Medicine

## 2016-03-05 DIAGNOSIS — Z79899 Other long term (current) drug therapy: Secondary | ICD-10-CM | POA: Insufficient documentation

## 2016-03-05 DIAGNOSIS — Y999 Unspecified external cause status: Secondary | ICD-10-CM | POA: Insufficient documentation

## 2016-03-05 DIAGNOSIS — S43005A Unspecified dislocation of left shoulder joint, initial encounter: Secondary | ICD-10-CM

## 2016-03-05 DIAGNOSIS — Y92009 Unspecified place in unspecified non-institutional (private) residence as the place of occurrence of the external cause: Secondary | ICD-10-CM | POA: Insufficient documentation

## 2016-03-05 DIAGNOSIS — S43015A Anterior dislocation of left humerus, initial encounter: Secondary | ICD-10-CM | POA: Diagnosis not present

## 2016-03-05 DIAGNOSIS — W108XXA Fall (on) (from) other stairs and steps, initial encounter: Secondary | ICD-10-CM | POA: Insufficient documentation

## 2016-03-05 DIAGNOSIS — S4992XA Unspecified injury of left shoulder and upper arm, initial encounter: Secondary | ICD-10-CM | POA: Diagnosis present

## 2016-03-05 DIAGNOSIS — Y939 Activity, unspecified: Secondary | ICD-10-CM | POA: Diagnosis not present

## 2016-03-05 MED ORDER — HYDROCODONE-ACETAMINOPHEN 5-325 MG PO TABS: 2.0000 | ORAL_TABLET | ORAL | 0 refills | Status: DC | PRN

## 2016-03-05 MED ORDER — HYDROMORPHONE HCL 1 MG/ML IJ SOLN
1.0000 mg | Freq: Once | INTRAMUSCULAR | Status: AC
  Administered 2016-03-05: 1 mg via INTRAVENOUS
  Filled 2016-03-05: qty 1

## 2016-03-05 MED ORDER — ONDANSETRON HCL 4 MG/2ML IJ SOLN
4.0000 mg | Freq: Once | INTRAMUSCULAR | Status: AC
  Administered 2016-03-05: 4 mg via INTRAVENOUS
  Filled 2016-03-05: qty 2

## 2016-03-05 NOTE — ED Triage Notes (Signed)
Pt reports tripping and falling down stairs at home, reports L upper arm pain, denies LOC.

## 2016-03-05 NOTE — Discharge Instructions (Signed)
Return if any problems. See the Orthopaedist for recheck  °

## 2016-03-05 NOTE — ED Provider Notes (Signed)
[facility] Provider Note   CSN: [id number] Arrival date & time: 03/05/16  1925 By signing my name below, I, Bridgette Habermann, attest that this documentation has been prepared under the direction and in the presence of Langston Masker, [place]. Electronically Signed: Bridgette Habermann, ED Scribe. 03/05/16. 8:25 PM.  History   Chief Complaint Chief Complaint  Patient presents with  . Fall    HPI The history is provided by the patient. No language interpreter was used.   HPI Comments: Tonya Douglas is a 31 y.o. female with no pertinent PMHx, who presents to the Emergency Department complaining of left shoulder pain following mechanical fall just PTA. Pt reports she tripped and fell down the stairs at home. No LOC, she denies head injury. Pain is exacerbated with movement and palpation of the area. No alleviating factors noted. Pt denies any additional injuries. No focal numbness.  Past Medical History:  Diagnosis Date  . 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 Abnormal Pap smear 02/2010   LSIL, COLPO Neg  . STD (sexually transmitted disease) 06/2009   + chlam   . STD (sexually transmitted disease) 03/29/11   + HSV II proven C&S vulva  . STD (sexually transmitted disease) 04/11/11   + RPR titer, confirmation test neg do T.pallidium test (lab [id number])    There are no active problems to display for this patient.   Past Surgical History:  Procedure Laterality Date  . COLPOSCOPY  07/2009   CIN I    OB History    Gravida Para Term Preterm AB Living   0 0 0 0 0 0   SAB TAB Ectopic Multiple Live Births   0 0 0 0 0       Home Medications    Prior to Admission medications   Medication Sig Start Date End Date Taking? Authorizing Provider  Biotin 10 MG TABS Take 1 tablet by mouth daily.    Historical Provider, MD  DENTA 5000 PLUS 1.1 % CREA dental cream See admin instructions. 03/26/15   Historical Provider, MD  Flaxseed, Linseed, (FLAX SEED OIL) 1000 MG CAPS Take 1  capsule by mouth daily.    Historical Provider, MD  FOLIC ACID PO Take by mouth 4 (four) times a week.    Historical Provider, MD  levonorgestrel-ethinyl estradiol (NORDETTE) 0.15-30 MG-MCG tablet Take 1 tablet by mouth daily. 11/23/15   Ria Comment, FNP  Multiple Vitamin (MULTI-VITAMIN PO) Take by mouth 4 (four) times a week.    Historical Provider, MD  valACYclovir (VALTREX) 1000 MG tablet TAKE 1 TABLET (1,000 MG TOTAL) BY MOUTH 2 (TWO) TIMES DAILY. TAKE FOR 10 DAYS 01/16/16   Ria Comment, FNP  vitamin B-12 (CYANOCOBALAMIN) 1000 MCG tablet Take 1,000 mcg by mouth daily.    Historical Provider, MD    Family History Family History  Problem Relation Age of Onset  . Hypertension Mother   . Hypertension Father     Social History Social History  Substance Use Topics  . Smoking status: Never Smoker  . Smokeless tobacco: Never Used  . Alcohol use 0.0 oz/week     Allergies   Patient has no known allergies.   Review of Systems Review of Systems  Constitutional: Negative for chills and fever.  Musculoskeletal: Positive for arthralgias, joint swelling and myalgias.  Neurological: Negative for numbness.  All other systems reviewed and are negative.  Physical Exam Updated Vital Signs BP 123/80 (BP Location: Right Arm)  Pulse 75   Temp 97.9 F (36.6 C) (Oral)   Resp 20   LMP 03/05/2016   SpO2 100%   Physical Exam  Constitutional: She appears well-developed and well-nourished.  HENT:  Head: Normocephalic.  Eyes: Conjunctivae are normal.  Cardiovascular: Normal rate.   Pulmonary/Chest: Effort normal. No respiratory distress.  Abdominal: She exhibits no distension.  Musculoskeletal: Normal range of motion. She exhibits tenderness and deformity.  Obvious deformity to left shoulder, drop-off present  Neurological: She is alert.  Skin: Skin is warm and dry.  Psychiatric: She has a normal mood and affect. Her behavior is normal.  Nursing note and vitals reviewed.  ED  Treatments / Results  DIAGNOSTIC STUDIES: Oxygen Saturation is 100% on RA, normal by my interpretation.    COORDINATION OF CARE: 8:24 PM Discussed treatment plan with pt at bedside which includes x-ray and pt agreed to plan.  Labs (all labs ordered are listed, but only abnormal results are displayed) Labs Reviewed - No data to display  EKG  EKG Interpretation None       Radiology No results found.  Procedures Reduction of dislocation Date/Time: 03/06/2016 1:14 AM Performed by: Elson AreasSOFIA, Shacoria Latif K Authorized by: Elson AreasSOFIA, Kassidy Dockendorf K  Consent: Verbal consent obtained. Consent given by: patient Patient understanding: patient states understanding of the procedure being performed Imaging studies: imaging studies available Patient identity confirmed: verbally with patient Time out: Immediately prior to procedure a "time out" was called to verify the correct patient, procedure, equipment, support staff and site/side marked as required. Local anesthesia used: no  Anesthesia: Local anesthesia used: no Patient tolerance: Patient tolerated the procedure well with no immediate complications Comments: Shoulder reduced with gentle traction and massage.   Post reduction xray  No fracture, reduction    (including critical care time)  Medications Ordered in ED Medications  HYDROmorphone (DILAUDID) injection 1 mg (not administered)  ondansetron (ZOFRAN) injection 4 mg (not administered)     Initial Impression / Assessment and Plan / ED Course  I have reviewed the triage vital signs and the nursing notes.  Pertinent labs & imaging results that were available during my care of the patient were reviewed by me and considered in my medical decision making (see chart for details).     Pt counseled on injury and advised to follow up with Orthopaedist for recheck  Final Clinical Impressions(s) / ED Diagnoses   Final diagnoses:  Dislocation of left shoulder joint, initial encounter    New  Prescriptions New Prescriptions   No medications on file   An After Visit Summary was printed and given to the patient.  I personally performed the services in this documentation, which was scribed in my presence.  The recorded information has been reviewed and considered.   Barnet PallKaren SofiaPAC. Scheduled Meds: Continuous Infusions: PRN Meds:.     Elson AreasLeslie K Chayim Bialas, PA-C 03/06/16 [id number]    Vanetta MuldersScott Zackowski, MD 03/08/16 [phone]

## 2016-03-23 ENCOUNTER — Ambulatory Visit (INDEPENDENT_AMBULATORY_CARE_PROVIDER_SITE_OTHER): Payer: 59 | Admitting: Nurse Practitioner

## 2016-03-23 ENCOUNTER — Encounter: Payer: Self-pay | Admitting: Nurse Practitioner

## 2016-03-23 VITALS — BP 112/70 | HR 64 | Ht 64.0 in | Wt 151.0 lb

## 2016-03-23 DIAGNOSIS — N760 Acute vaginitis: Secondary | ICD-10-CM | POA: Diagnosis not present

## 2016-03-23 DIAGNOSIS — Z113 Encounter for screening for infections with a predominantly sexual mode of transmission: Secondary | ICD-10-CM

## 2016-03-23 NOTE — Patient Instructions (Addendum)
Sexually Transmitted Disease A sexually transmitted disease (STD) is a disease or infection often passed to another person during sex. However, STDs can be passed through nonsexual ways. An STD can be passed through:  Spit (saliva).  Semen.  Blood.  Mucus from the vagina.  Pee (urine). How can I lessen my chances of getting an STD?  Use:  Latex condoms.  Water-soluble lubricants with condoms. Do not use petroleum jelly or oils.  Dental dams. These are small pieces of latex that are used as a barrier during oral sex.  Avoid having more than one sex partner.  Do not have sex with someone who has other sex partners.  Do not have sex with anyone you do not know or who is at high risk for an STD.  Avoid risky sex that can break your skin.  Do not have sex if you have open sores on your mouth or skin.  Avoid drinking too much alcohol or taking illegal drugs. Alcohol and drugs can affect your good judgment.  Avoid oral and anal sex acts.  Get shots (vaccines) for HPV and hepatitis.  If you are at risk of being infected with HIV, it is advised that you take a certain medicine daily to prevent HIV infection. This is called pre-exposure prophylaxis (PrEP). You may be at risk if:  You are a man who has sex with other men (MSM).  You are attracted to the opposite sex (heterosexual) and are having sex with more than one partner.  You take drugs with a needle.  You have sex with someone who has HIV.  Talk with your doctor about if you are at high risk of being infected with HIV. If you begin to take PrEP, get tested for HIV first. Get tested every 3 months for as long as you are taking PrEP.  Get tested for STDs every year if you are sexually active. If you are treated for an STD, get tested again 3 months after you are treated. What should I do if I think I have an STD?  See your doctor.  Tell your sex partner(s) that you have an STD. They should be tested and treated.  Do  not have sex until your doctor says it is okay. When should I get help? Get help right away if:  You have bad belly (abdominal) pain.  You are a man and have puffiness (swelling) or pain in your testicles.  You are a woman and have puffiness in your vagina. This information is not intended to replace advice given to you by your health care provider. Make sure you discuss any questions you have with your health care provider. Document Released: 03/01/2004 Document Revised: 06/30/2015 Document Reviewed: 07/18/2012 Elsevier Interactive Patient Education  2017 Elsevier Inc.  

## 2016-03-23 NOTE — Progress Notes (Signed)
31 y.o. Single Hispanic female G0P0000 here with complaint of vaginal symptoms of pink discharge with wiping occasionally - not sure if this is mid cycle.  She likes to come in every 3 months for a STD check.  Slight vagina itching thought may be from HSV.  Same partner for 3 months.   Denies new personal products or vaginal dryness.  No real concerns of STD exposure. Urinary symptoms none . Contraception is OCP.  She is considering several herbal supplements and will discuss those with [facility].  She is on daily Valtrex and felt like she had an outbreak last week and did increase her dosage for 3 days.  She also had a fall down the steps and dislocated her left shoulder -treated with arm sling and working on ROM exercise only without weights.   O:  Healthy female WDWN Affect: normal, orientation x 3  Exam: no distress Abdomen: soft and non tender Lymph node: no enlargement or tenderness Pelvic exam: External genital: normal female BUS: negative Vagina: thin white discharge noted.  Affirm taken. Cervix: normal, non tender, no cervicitis Adnexa:normal, non tender, no masses or fullness noted    A: R/O Vaginitis  R/O STD's  Recent dislocated left shoulder   P: Discussed findings of vaginitis and etiology. Discussed Aveeno or baking soda sitz bath for comfort. Avoid moist clothes or pads for extended period of time. If working out in gym clothes or swim suits for long periods of time change underwear or bottoms of swimsuit if possible. Olive Oil/Coconut Oil use for skin protection prior to activity can be used to external skin.  Rx: none  Follow with Affirm and STD's  RV prn

## 2016-03-24 LAB — WET PREP BY MOLECULAR PROBE
CANDIDA SPECIES: POSITIVE — AB
Gardnerella vaginalis: NEGATIVE
TRICHOMONAS VAG: NEGATIVE

## 2016-03-24 LAB — STD PANEL
HEP B S AG: NEGATIVE
HIV: NONREACTIVE

## 2016-03-25 LAB — GC/CHLAMYDIA PROBE AMP
CT PROBE, AMP APTIMA: NOT DETECTED
GC Probe RNA: NOT DETECTED

## 2016-03-25 NOTE — Progress Notes (Signed)
Encounter reviewed by Dr. Janean SarkBrook Amundson C. Silva. I recommend condom use for STD prevention.

## 2016-03-26 MED ORDER — FLUCONAZOLE 150 MG PO TABS: 150.0000 mg | ORAL_TABLET | Freq: Once | ORAL | 0 refills | Status: DC

## 2016-03-26 NOTE — Addendum Note (Signed)
Addended by: Francee PiccoloPHILLIPS, Jovontae Banko C on: 03/26/2016 11:01 AM   Modules accepted: Orders

## 2016-03-31 ENCOUNTER — Other Ambulatory Visit: Payer: Self-pay | Admitting: Nurse Practitioner

## 2016-04-02 NOTE — Telephone Encounter (Signed)
Medication refill request: diflucan  Last AEX:  11/23/15 PG Next AEX: 11/27/16 PG  Last MMG (if hormonal medication request): none Refill authorized: please advise.

## 2016-04-25 ENCOUNTER — Encounter: Payer: Self-pay | Admitting: Nurse Practitioner

## 2016-04-25 ENCOUNTER — Ambulatory Visit (INDEPENDENT_AMBULATORY_CARE_PROVIDER_SITE_OTHER): Payer: 59 | Admitting: Nurse Practitioner

## 2016-04-25 VITALS — BP 120/76 | HR 100 | Temp 97.6°F | Resp 16 | Ht 64.0 in | Wt 152.0 lb

## 2016-04-25 DIAGNOSIS — N760 Acute vaginitis: Secondary | ICD-10-CM

## 2016-04-25 DIAGNOSIS — Z113 Encounter for screening for infections with a predominantly sexual mode of transmission: Secondary | ICD-10-CM | POA: Diagnosis not present

## 2016-04-25 NOTE — Patient Instructions (Signed)
Sexually Transmitted Disease A sexually transmitted disease (STD) is a disease or infection often passed to another person during sex. However, STDs can be passed through nonsexual ways. An STD can be passed through:  Spit (saliva).  Semen.  Blood.  Mucus from the vagina.  Pee (urine). How can I lessen my chances of getting an STD?  Use:  Latex condoms.  Water-soluble lubricants with condoms. Do not use petroleum jelly or oils.  Dental dams. These are small pieces of latex that are used as a barrier during oral sex.  Avoid having more than one sex partner.  Do not have sex with someone who has other sex partners.  Do not have sex with anyone you do not know or who is at high risk for an STD.  Avoid risky sex that can break your skin.  Do not have sex if you have open sores on your mouth or skin.  Avoid drinking too much alcohol or taking illegal drugs. Alcohol and drugs can affect your good judgment.  Avoid oral and anal sex acts.  Get shots (vaccines) for HPV and hepatitis.  If you are at risk of being infected with HIV, it is advised that you take a certain medicine daily to prevent HIV infection. This is called pre-exposure prophylaxis (PrEP). You may be at risk if:  You are a man who has sex with other men (MSM).  You are attracted to the opposite sex (heterosexual) and are having sex with more than one partner.  You take drugs with a needle.  You have sex with someone who has HIV.  Talk with your doctor about if you are at high risk of being infected with HIV. If you begin to take PrEP, get tested for HIV first. Get tested every 3 months for as long as you are taking PrEP.  Get tested for STDs every year if you are sexually active. If you are treated for an STD, get tested again 3 months after you are treated. What should I do if I think I have an STD?  See your doctor.  Tell your sex partner(s) that you have an STD. They should be tested and treated.  Do  not have sex until your doctor says it is okay. When should I get help? Get help right away if:  You have bad belly (abdominal) pain.  You are a man and have puffiness (swelling) or pain in your testicles.  You are a woman and have puffiness in your vagina. This information is not intended to replace advice given to you by your health care provider. Make sure you discuss any questions you have with your health care provider. Document Released: 03/01/2004 Document Revised: 06/30/2015 Document Reviewed: 07/18/2012 Elsevier Interactive Patient Education  2017 Elsevier Inc.  

## 2016-04-25 NOTE — Progress Notes (Signed)
31 y.o. Single Hispanic female G0P0000 here for evaluation of STD's.  LMP: 04/07/2016.    Contraception is OCP.  She was treated for a yeast infection before going to [place].  While there felt better but did consume a lot of carb's.  She now feels yeast may be back again.  She also wants to be rechecked for STD's.  No change in partner.     O: Healthy WD,WN female Affect: normal  Abdomen:soft, non tender, normal bowel sounds Pelvic exam:EXTERNAL GENITALIA: normal appearing vulva with no masses, tenderness or lesions VAGINA: discharge that is clear CERVIX: no lesions or cervical motion tenderness  A: R/O STD's  Vaginitis    P:  Discussed that will call with Affirm and STD's when back.  No treatment started yet.   Labs: STD panel, HSV,GC/Chl/ Affirm     RV

## 2016-04-26 LAB — GC/CHLAMYDIA PROBE AMP
CT PROBE, AMP APTIMA: NOT DETECTED
GC PROBE AMP APTIMA: NOT DETECTED

## 2016-04-26 LAB — WET PREP BY MOLECULAR PROBE
Candida species: NOT DETECTED
Gardnerella vaginalis: NOT DETECTED
TRICHOMONAS VAG: NOT DETECTED

## 2016-04-26 LAB — HSV(HERPES SIMPLEX VRS) I + II AB-IGG
HSV 1 Glycoprotein G Ab, IgG: <0.9 Index (ref <0.90)
HSV 2 Glycoprotein G Ab, IgG: 4.92 Index — ABNORMAL HIGH (ref <0.90)

## 2016-04-26 LAB — STD PANEL
HEP B S AG: NEGATIVE
HIV: NONREACTIVE

## 2016-04-26 NOTE — Progress Notes (Signed)
Encounter reviewed by Dr. Brook Amundson C. Silva.  

## 2016-04-27 LAB — HSV 1/2 AB (IGM), IFA W/RFLX TITER
HSV 1 IGM SCREEN: NEGATIVE
HSV 2 IGM SCREEN: NEGATIVE

## 2016-06-27 ENCOUNTER — Other Ambulatory Visit: Payer: Self-pay | Admitting: Obstetrics and Gynecology

## 2016-06-27 NOTE — Telephone Encounter (Signed)
Medication refill request: OCP Last AEX:  11-17-14 Last OV: 04-25-16  Next AEX: 11-27-16  Last MMG (if hormonal medication request): N/A Refill authorized: please advise

## 2016-07-13 ENCOUNTER — Other Ambulatory Visit: Payer: Self-pay | Admitting: Nurse Practitioner

## 2016-07-13 NOTE — Telephone Encounter (Signed)
Medication refill request: diflucan  Last AEX:  11/23/15 PG Next AEX: 11/27/16 PG Last MMG (if hormonal medication request): none Refill authorized: please advise.

## 2016-07-16 ENCOUNTER — Ambulatory Visit (INDEPENDENT_AMBULATORY_CARE_PROVIDER_SITE_OTHER): Payer: 59 | Admitting: Nurse Practitioner

## 2016-07-16 ENCOUNTER — Encounter: Payer: Self-pay | Admitting: Nurse Practitioner

## 2016-07-16 VITALS — BP 122/80 | HR 76 | Ht 64.0 in | Wt 160.0 lb

## 2016-07-16 DIAGNOSIS — N760 Acute vaginitis: Secondary | ICD-10-CM | POA: Diagnosis not present

## 2016-07-16 NOTE — Patient Instructions (Signed)
Vaginal Yeast infection, Adult Vaginal yeast infection is a condition that causes soreness, swelling, and redness (inflammation) of the vagina. It also causes vaginal discharge. This is a common condition. Some women get this infection frequently. What are the causes? This condition is caused by a change in the normal balance of the yeast (candida) and bacteria that live in the vagina. This change causes an overgrowth of yeast, which causes the inflammation. What increases the risk? This condition is more likely to develop in:  Women who take antibiotic medicines.  Women who have diabetes.  Women who take birth control pills.  Women who are pregnant.  Women who douche often.  Women who have a weak defense (immune) system.  Women who have been taking steroid medicines for a long time.  Women who frequently wear tight clothing.  What are the signs or symptoms? Symptoms of this condition include:  White, thick vaginal discharge.  Swelling, itching, redness, and irritation of the vagina. The lips of the vagina (vulva) may be affected as well.  Pain or a burning feeling while urinating.  Pain during sex.  How is this diagnosed? This condition is diagnosed with a medical history and physical exam. This will include a pelvic exam. Your health care provider will examine a sample of your vaginal discharge under a microscope. Your health care provider may send this sample for testing to confirm the diagnosis. How is this treated? This condition is treated with medicine. Medicines may be over-the-counter or prescription. You may be told to use one or more of the following:  Medicine that is taken orally.  Medicine that is applied as a cream.  Medicine that is inserted directly into the vagina (suppository).  Follow these instructions at home:  Take or apply over-the-counter and prescription medicines only as told by your health care provider.  Do not have sex until your health  care provider has approved. Tell your sex partner that you have a yeast infection. That person should go to his or her health care provider if he or she develops symptoms.  Do not wear tight clothes, such as pantyhose or tight pants.  Avoid using tampons until your health care provider approves.  Eat more yogurt. This may help to keep your yeast infection from returning.  Try taking a sitz bath to help with discomfort. This is a warm water bath that is taken while you are sitting down. The water should only come up to your hips and should cover your buttocks. Do this 3-4 times per day or as told by your health care provider.  Do not douche.  Wear breathable, cotton underwear.  If you have diabetes, keep your blood sugar levels under control. Contact a health care provider if:  You have a fever.  Your symptoms go away and then return.  Your symptoms do not get better with treatment.  Your symptoms get worse.  You have new symptoms.  You develop blisters in or around your vagina.  You have blood coming from your vagina and it is not your menstrual period.  You develop pain in your abdomen. This information is not intended to replace advice given to you by your health care provider. Make sure you discuss any questions you have with your health care provider. Document Released: 11/01/2004 Document Revised: 07/06/2015 Document Reviewed: 07/26/2014 Elsevier Interactive Patient Education  2018 [employer]  Pro B

## 2016-07-16 NOTE — Progress Notes (Signed)
Patient ID: Tonya DammeBreena Douglas, female   DOB: [birth year], 31 y.o.   MRN: [id number]  31 y.o. Single Hispanic female G0P0000 here with complaint of vaginal symptoms of increase discharge. Describes discharge as white thick and chunky.  Same partner for 6 months.  Has had STD's done but wants to repeat GC and Chlamydia due to discharge.  She has chronic yeast symptoms that do flare with SA.  She is very careful with diet and eliminating sugars.   Onset of symptoms 4-5 days.   Denies new personal products or vaginal dryness.  No STD concerns. Urinary symptoms none.   Contraception is OCP.  Past Medical History:  Diagnosis Date  . 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 Abnormal Pap smear 02/2010   LSIL, COLPO Neg  . STD (sexually transmitted disease) 06/2009   + chlam   . STD (sexually transmitted disease) 03/29/11   + HSV II proven C&S vulva  . STD (sexually transmitted disease) 04/11/11   + RPR titer, confirmation test neg do T.pallidium test (lab [id number])    Past Surgical History:  Procedure Laterality Date  . COLPOSCOPY  07/2009   CIN I     Current Outpatient Prescriptions:  .  ALTAVERA 0.15-30 MG-MCG tablet, TAKE 1 TABLET BY MOUTH DAILY., Disp: 84 tablet, Rfl: 0 .  DENTA 5000 PLUS 1.1 % CREA dental cream, See admin instructions., Disp: , Rfl: 4 .  Flaxseed, Linseed, (FLAX SEED OIL) 1000 MG CAPS, Take 1 capsule by mouth daily., Disp: , Rfl:  .  FOLIC ACID PO, Take by mouth 4 (four) times a week., Disp: , Rfl:  .  Multiple Vitamin (MULTI-VITAMIN PO), Take by mouth 4 (four) times a week., Disp: , Rfl:  .  valACYclovir (VALTREX) 1000 MG tablet, TAKE 1 TABLET (1,000 MG TOTAL) BY MOUTH 2 (TWO) TIMES DAILY. TAKE FOR 10 DAYS, Disp: 90 tablet, Rfl: 3 .  vitamin B-12 (CYANOCOBALAMIN) 1000 MCG tablet, Take 1,000 mcg by mouth daily., Disp: , Rfl:   ALLERGIES: Patient has no known allergies.  O:  Healthy female WDWN Affect: normal, orientation x 3  Physical Exam:  BP 122/80  (BP Location: Right Arm, Patient Position: Sitting, Cuff Size: Normal)   Pulse 76   Ht 5\' 4"  (1.626 m)   Wt 160 lb (72.6 kg)   LMP 06/23/2016 (Exact Date)   BMI 27.46 kg/m  General appearance: alert, cooperative and appears stated age Abdomen: soft and non tender Lymph node: no enlargement or tenderness Pelvic exam: External genital: normal female BUS: negative, no HSV lesions Vagina: white thin discharge noted.  Affirm taken. Cervix: normal, non tender, no CMT Uterus: normal, non tender Adnexa:normal, non tender, no masses or fullness noted    A: R/O Vaginitis  History of chronic yeast with normal labs at PCP per pt.  History of HSV and suppression therapy daily   P: Discussed findings of vaginitis and etiology. Discussed Aveeno or baking soda sitz bath for comfort. Avoid moist clothes or pads for extended period of time. If working out in gym clothes or swim suits for long periods of time change underwear or bottoms of swimsuit if possible. Olive Oil/Coconut Oil use for skin protection prior to activity can be used to external skin.  Rx: hold awaiting test results  Follow with Affirm  She is advised to get Pro B.  RV prn

## 2016-07-17 LAB — VAGINITIS/VAGINOSIS, DNA PROBE
Candida Species: NEGATIVE
GARDNERELLA VAGINALIS: POSITIVE — AB
Trichomonas vaginosis: NEGATIVE

## 2016-07-18 LAB — GC/CHLAMYDIA PROBE AMP
Chlamydia trachomatis, NAA: NEGATIVE
NEISSERIA GONORRHOEAE BY PCR: NEGATIVE

## 2016-07-18 MED ORDER — METRONIDAZOLE 500 MG PO TABS: 500.0000 mg | ORAL_TABLET | Freq: Two times a day (BID) | ORAL | 0 refills | Status: DC

## 2016-07-18 NOTE — Progress Notes (Signed)
Encounter reviewed by Dr. Janean SarkBrook Amundson C. Silva. GC/CT and vaginitis/vaginosis probes performed.

## 2016-07-31 ENCOUNTER — Ambulatory Visit (INDEPENDENT_AMBULATORY_CARE_PROVIDER_SITE_OTHER): Payer: 59 | Admitting: Nurse Practitioner

## 2016-07-31 ENCOUNTER — Encounter: Payer: Self-pay | Admitting: Nurse Practitioner

## 2016-07-31 ENCOUNTER — Other Ambulatory Visit: Payer: Self-pay | Admitting: Nurse Practitioner

## 2016-07-31 VITALS — BP 126/86 | HR 60 | Temp 98.3°F | Resp 16 | Ht 64.0 in | Wt 161.0 lb

## 2016-07-31 DIAGNOSIS — N760 Acute vaginitis: Secondary | ICD-10-CM

## 2016-07-31 DIAGNOSIS — N926 Irregular menstruation, unspecified: Secondary | ICD-10-CM

## 2016-07-31 LAB — POCT URINE PREGNANCY: PREG TEST UR: NEGATIVE

## 2016-07-31 NOTE — Patient Instructions (Signed)

## 2016-07-31 NOTE — Progress Notes (Signed)
31 y.o. Single Hispanic female G0P0000 here for follow up of BV treated on 07/16/16 with Flagyl.  She does not feel that she is irritated, vaginal odor, or discharge.  she is concerned that this is recurrent and that her partner may be giving this bask to her.  He has been tested for STD's which are negative.  Still on OCP but this past cycle was lighter.  No change in partner.  Missed 2 pills this past cycle but took them shortly after.   We have discussed other BC methods but she is wanting to change to Nuva ring or IUD with her history of vaginitis.  She denies any urinary symptoms or pelvic pain.   Past Medical History:  Diagnosis Date  . 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 Abnormal Pap smear 02/2010   LSIL, COLPO Neg  . STD (sexually transmitted disease) 06/2009   + chlam   . STD (sexually transmitted disease) 03/29/11   + HSV II proven C&S vulva  . STD (sexually transmitted disease) 04/11/11   + RPR titer, confirmation test neg do T.pallidium test (lab [id number])   Current Outpatient Prescriptions on File Prior to Visit  Medication Sig Dispense Refill  . ALTAVERA 0.15-30 MG-MCG tablet TAKE 1 TABLET BY MOUTH DAILY. 84 tablet 0  . DENTA 5000 PLUS 1.1 % CREA dental cream See admin instructions.  4  . Flaxseed, Linseed, (FLAX SEED OIL) 1000 MG CAPS Take 1 capsule by mouth daily.    Marland Kitchen. FOLIC ACID PO Take by mouth 4 (four) times a week.    . Multiple Vitamin (MULTI-VITAMIN PO) Take by mouth 4 (four) times a week.    . valACYclovir (VALTREX) 1000 MG tablet TAKE 1 TABLET (1,000 MG TOTAL) BY MOUTH 2 (TWO) TIMES DAILY. TAKE FOR 10 DAYS 90 tablet 3  . vitamin B-12 (CYANOCOBALAMIN) 1000 MCG tablet Take 1,000 mcg by mouth daily.     No current facility-administered medications on file prior to visit.     O: Healthy WD,WN female Affect: no distress Abdomen:soft, non tender, normal bowel sounds Pelvic exam:EXTERNAL GENITALIA: normal appearing vulva with no masses, tenderness  or lesions VAGINA: no abnormal discharge or lesions and discharge that is clear CERVIX: no lesions or cervical motion tenderness UTERUS: normal   UPT:  Negative - done per request  A: Recent treatment for BV with history of recurrent  TOC per request and concerns that partner may need to be treated   P:  Discussed if BV is present - partner will need to get treatment at his MD.  Again followed with discussion of other BC options and she wants to continue with OCP.   Labs: Affirm    RV

## 2016-08-01 LAB — VAGINITIS/VAGINOSIS, DNA PROBE
CANDIDA SPECIES: POSITIVE — AB
GARDNERELLA VAGINALIS: NEGATIVE
Trichomonas vaginosis: NEGATIVE

## 2016-08-02 NOTE — Progress Notes (Signed)
Reviewed personally.  M. Suzanne Angeleen Horney, MD.  

## 2016-08-03 ENCOUNTER — Telehealth: Payer: Self-pay | Admitting: *Deleted

## 2016-08-03 MED ORDER — FLUCONAZOLE 150 MG PO TABS: 150.0000 mg | ORAL_TABLET | Freq: Once | ORAL | 0 refills | Status: AC

## 2016-08-03 NOTE — Addendum Note (Signed)
Addended by: Luisa DagoPHILLIPS, STEPHANIE C on: 08/03/2016 09:27 AM   Modules accepted: Orders

## 2016-08-03 NOTE — Telephone Encounter (Signed)
I have attempted to contact this patient by phone with the following results: Unable to leave message, voice mailbox full. I will continue to try later.

## 2016-08-03 NOTE — Telephone Encounter (Signed)
-----   Message from Ria CommentPatricia Grubb, FNP sent at 08/03/2016  8:48 AM EDT ----- Please let pt know that BV is gone as she was concerned about recurrence.  She does have a yeast infection and she can have Diflucan.

## 2016-08-13 NOTE — Telephone Encounter (Signed)
Patient returned call on 08/03/16 and was notified in result note. Closing encounter.

## 2016-09-05 ENCOUNTER — Encounter: Payer: Self-pay | Admitting: Obstetrics and Gynecology

## 2016-09-05 ENCOUNTER — Ambulatory Visit (INDEPENDENT_AMBULATORY_CARE_PROVIDER_SITE_OTHER): Payer: 59 | Admitting: Obstetrics and Gynecology

## 2016-09-05 ENCOUNTER — Telehealth: Payer: Self-pay | Admitting: Obstetrics and Gynecology

## 2016-09-05 VITALS — BP 110/70 | HR 88 | Resp 14 | Wt 158.0 lb

## 2016-09-05 DIAGNOSIS — Z3009 Encounter for other general counseling and advice on contraception: Secondary | ICD-10-CM | POA: Diagnosis not present

## 2016-09-05 DIAGNOSIS — N898 Other specified noninflammatory disorders of vagina: Secondary | ICD-10-CM

## 2016-09-05 DIAGNOSIS — Z113 Encounter for screening for infections with a predominantly sexual mode of transmission: Secondary | ICD-10-CM

## 2016-09-05 NOTE — Telephone Encounter (Signed)
Patient was seen today and has a question for Dr.Jertson.

## 2016-09-05 NOTE — Patient Instructions (Signed)
Contraception Choices Contraception (birth control) is the use of any methods or devices to prevent pregnancy. Below are some methods to help avoid pregnancy. Hormonal methods  Contraceptive implant. This is a thin, plastic tube containing progesterone hormone. It does not contain estrogen hormone. Your health care provider inserts the tube in the inner part of the upper arm. The tube can remain in place for up to 3 years. After 3 years, the implant must be removed. The implant prevents the ovaries from releasing an egg (ovulation), thickens the cervical mucus to prevent sperm from entering the uterus, and thins the lining of the inside of the uterus.  Progesterone-only injections. These injections are given every 3 months by your health care provider to prevent pregnancy. This synthetic progesterone hormone stops the ovaries from releasing eggs. It also thickens cervical mucus and changes the uterine lining. This makes it harder for sperm to survive in the uterus.  Birth control pills. These pills contain estrogen and progesterone hormone. They work by preventing the ovaries from releasing eggs (ovulation). They also cause the cervical mucus to thicken, preventing the sperm from entering the uterus. Birth control pills are prescribed by a health care provider.Birth control pills can also be used to treat heavy periods.  Minipill. This type of birth control pill contains only the progesterone hormone. They are taken every day of each month and must be prescribed by your health care provider.  Birth control patch. The patch contains hormones similar to those in birth control pills. It must be changed once a week and is prescribed by a health care provider.  Vaginal ring. The ring contains hormones similar to those in birth control pills. It is left in the vagina for 3 weeks, removed for 1 week, and then a new one is put back in place. The patient must be comfortable inserting and removing the ring from  the vagina.A health care provider's prescription is necessary.  Emergency contraception. Emergency contraceptives prevent pregnancy after unprotected sexual intercourse. This pill can be taken right after sex or up to 5 days after unprotected sex. It is most effective the sooner you take the pills after having sexual intercourse. Most emergency contraceptive pills are available without a prescription. Check with your pharmacist. Do not use emergency contraception as your only form of birth control. Barrier methods  Female condom. This is a thin sheath (latex or rubber) that is worn over the penis during sexual intercourse. It can be used with spermicide to increase effectiveness.  Female condom. This is a soft, loose-fitting sheath that is put into the vagina before sexual intercourse.  Diaphragm. This is a soft, latex, dome-shaped barrier that must be fitted by a health care provider. It is inserted into the vagina, along with a spermicidal jelly. It is inserted before intercourse. The diaphragm should be left in the vagina for 6 to 8 hours after intercourse.  Cervical cap. This is a round, soft, latex or plastic cup that fits over the cervix and must be fitted by a health care provider. The cap can be left in place for up to 48 hours after intercourse.  Sponge. This is a soft, circular piece of polyurethane foam. The sponge has spermicide in it. It is inserted into the vagina after wetting it and before sexual intercourse.  Spermicides. These are chemicals that kill or block sperm from entering the cervix and uterus. They come in the form of creams, jellies, suppositories, foam, or tablets. They do not require a prescription. They   are inserted into the vagina with an applicator before having sexual intercourse. The process must be repeated every time you have sexual intercourse. Intrauterine contraception  Intrauterine device (IUD). This is a T-shaped device that is put in a woman's uterus during  a menstrual period to prevent pregnancy. There are 2 types: ? Copper IUD. This type of IUD is wrapped in copper wire and is placed inside the uterus. Copper makes the uterus and fallopian tubes produce a fluid that kills sperm. It can stay in place for 10 years. ? Hormone IUD. This type of IUD contains the hormone progestin (synthetic progesterone). The hormone thickens the cervical mucus and prevents sperm from entering the uterus, and it also thins the uterine lining to prevent implantation of a fertilized egg. The hormone can weaken or kill the sperm that get into the uterus. It can stay in place for 3-5 years, depending on which type of IUD is used. Permanent methods of contraception  Female tubal ligation. This is when the woman's fallopian tubes are surgically sealed, tied, or blocked to prevent the egg from traveling to the uterus.  Hysteroscopic sterilization. This involves placing a small coil or insert into each fallopian tube. Your doctor uses a technique called hysteroscopy to do the procedure. The device causes scar tissue to form. This results in permanent blockage of the fallopian tubes, so the sperm cannot fertilize the egg. It takes about 3 months after the procedure for the tubes to become blocked. You must use another form of birth control for these 3 months.  Female sterilization. This is when the female has the tubes that carry sperm tied off (vasectomy).This blocks sperm from entering the vagina during sexual intercourse. After the procedure, the man can still ejaculate fluid (semen). Natural planning methods  Natural family planning. This is not having sexual intercourse or using a barrier method (condom, diaphragm, cervical cap) on days the woman could become pregnant.  Calendar method. This is keeping track of the length of each menstrual cycle and identifying when you are fertile.  Ovulation method. This is avoiding sexual intercourse during ovulation.  Symptothermal method.  This is avoiding sexual intercourse during ovulation, using a thermometer and ovulation symptoms.  Post-ovulation method. This is timing sexual intercourse after you have ovulated. Regardless of which type or method of contraception you choose, it is important that you use condoms to protect against the transmission of sexually transmitted infections (STIs). Talk with your health care provider about which form of contraception is most appropriate for you. This information is not intended to replace advice given to you by your health care provider. Make sure you discuss any questions you have with your health care provider. Document Released: 01/22/2005 Document Revised: 06/30/2015 Document Reviewed: 07/17/2012 Elsevier Interactive Patient Education  2017 [employer]  

## 2016-09-05 NOTE — Telephone Encounter (Signed)
Spoke with patient. Advised testing for BV and yeast was included in testing. Patient verbalizes understanding and is agreeable.   Routing to provider for final review. Patient is agreeable to disposition. Will close encounter.

## 2016-09-05 NOTE — Telephone Encounter (Signed)
Patient was seen today and wants to make sure the STD testing includes yeast and BV.

## 2016-09-05 NOTE — Progress Notes (Signed)
GYNECOLOGY  VISIT   HPI: 31 y.o.   Single  Hispanic  female   G0P0000 with Patient's last menstrual period was 08/18/2016.   here for STD testing. She is c/o vaginal discharge     The patient was treated for BV on 07/16/16, yeast on 07/31/16, yeast in 2/18 She is in a committed relationship, but wants to do frequent STD testing. Has been with her partner for over 6 months. On daily folic acid.  She stopped OCP's 2 weeks ago, thought it might be contributing to the yeast.  She has been taking pro-B to prevent yeast. She was having d/c yesterday and the day before. The d/c was white and creamy. No itching, burning or irritation. Feels less lubricated with intercourse.  She took plan B on Friday night, started spotting yesterday. She is currently just using w/d for contraception.   GYNECOLOGIC HISTORY: Patient's last menstrual period was 08/18/2016. Contraception:none  Menopausal hormone therapy: none         OB History    Gravida Para Term Preterm AB Living   0 0 0 0 0 0   SAB TAB Ectopic Multiple Live Births   0 0 0 0 0         There are no active problems to display for this patient.   Past Medical History:  Diagnosis Date  . 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 Abnormal Pap smear 02/2010   LSIL, COLPO Neg  . STD (sexually transmitted disease) 06/2009   + chlam   . STD (sexually transmitted disease) 03/29/11   + HSV II proven C&S vulva  . 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 COLPOSCOPY  07/2009   CIN I    Current Outpatient Prescriptions  Medication Sig Dispense Refill  . DENTA 5000 PLUS 1.1 % CREA dental cream See admin instructions.  4  . Flaxseed, Linseed, (FLAX SEED OIL) 1000 MG CAPS Take 1 capsule by mouth daily.    Marland Kitchen. FOLIC ACID PO Take by mouth 4 (four) times a week.    . Multiple Vitamin (MULTI-VITAMIN PO)  Take by mouth 4 (four) times a week.    . valACYclovir (VALTREX) 1000 MG tablet TAKE 1 TABLET (1,000 MG TOTAL) BY MOUTH 2 (TWO) TIMES DAILY. TAKE FOR 10 DAYS 90 tablet 3   No current facility-administered medications for this visit.      ALLERGIES: Patient has no known allergies.  Family History  Problem Relation Age of Onset  . Hypertension Mother   . Hypertension Father     Social History   Social History  . Marital status: Single    Spouse name: N/A  . Number of children: N/A  . Years of education: N/A   Occupational History  . Not on file.   Social History Main Topics  . Smoking status: Never Smoker  . Smokeless tobacco: Never Used  . Alcohol use 0.0 oz/week  . Drug use: No  . Sexual activity: Yes    Partners: Male    Birth control/ protection: None   Other Topics Concern  . Not on file   Social History Narrative  . No narrative on file    Review of Systems  Constitutional: Negative.   HENT: Negative.   Eyes: Negative.   Respiratory: Negative.   Cardiovascular: Negative.  Gastrointestinal: Negative.   Genitourinary:       Vaginal discharge   Musculoskeletal: Negative.   Skin: Negative.   Neurological: Negative.   Endo/Heme/Allergies: Negative.   Psychiatric/Behavioral: Negative.     PHYSICAL EXAMINATION:    BP 110/70 (BP Location: Right Arm, Patient Position: Sitting, Cuff Size: Normal)   Pulse 88   Resp 14   Wt 158 lb (71.7 kg)   LMP 08/18/2016   BMI 27.12 kg/m     General appearance: alert, cooperative and appears stated age  Pelvic: External genitalia:  no lesions              Urethra:  normal appearing urethra with no masses, tenderness or lesions              Bartholins and Skenes: normal                 Vagina: normal appearing vagina with normal color, no significant discharge, no lesions              Cervix: no lesions               Chaperone was present for exam.  ASSESSMENT Vaginal d/c. Not seen on exam today. The patient has  had yeast x 2 and BV x 1 since 2/18. She is worried that the yeast came back. No vulvar c/o STD screening, the patient has been having very frequent STD testing, no change in partner x 6 months. Reviewed that we wouldn't typically check this more than yearly unless she is having a concern. She would like full testing today (other than HSV) Contraception, she is using rhythm and w/d for contraception. Went off the pill to "flush her body" and because she thought it may be contributing to the yeast    PLAN STD testing Affirm Discussed that she doesn't need to take a break from the pill We discussed the rhythm method of contraception and W/D Recommended she continue on folic acid Call with any questions or concerns   An After Visit Summary was printed and given to the patient.  20 minutes face to face time of which over 50% was spent in counseling.

## 2016-09-06 LAB — HEP, RPR, HIV PANEL
HIV Screen 4th Generation wRfx: NONREACTIVE
Hepatitis B Surface Ag: NEGATIVE
RPR: NONREACTIVE

## 2016-09-06 LAB — VAGINITIS/VAGINOSIS, DNA PROBE
CANDIDA SPECIES: NEGATIVE
Gardnerella vaginalis: NEGATIVE
Trichomonas vaginosis: NEGATIVE

## 2016-09-06 LAB — HEPATITIS C ANTIBODY

## 2016-09-07 LAB — GC/CHLAMYDIA PROBE AMP
CHLAMYDIA, DNA PROBE: NEGATIVE
NEISSERIA GONORRHOEAE BY PCR: NEGATIVE

## 2016-09-10 ENCOUNTER — Telehealth: Payer: Self-pay | Admitting: *Deleted

## 2016-09-10 NOTE — Telephone Encounter (Signed)
Mailbox full unable to leave message.

## 2016-09-10 NOTE — Telephone Encounter (Signed)
Patient returned call. Results reviewed with patient as seen below from Dr. Edward JollySilva. Patient verbalized understanding.   Patient agreeable to disposition. Will close encounter.

## 2016-09-10 NOTE — Telephone Encounter (Signed)
-----   Message from Tonya SallesBrook E Amundson C Silva, MD sent at 09/09/2016  1:31 PM EDT ----- This is Dr. Edward JollySilva reviewing labs for Dr. Oscar LaJertson while she is out of the office.  Please inform patient that GC/CT are both negative for infection.

## 2016-09-17 ENCOUNTER — Other Ambulatory Visit: Payer: Self-pay | Admitting: *Deleted

## 2016-09-17 MED ORDER — LEVONORGESTREL-ETHINYL ESTRAD 0.15-30 MG-MCG PO TABS: 1.0000 | ORAL_TABLET | Freq: Every day | ORAL | 0 refills | Status: DC

## 2016-09-17 NOTE — Telephone Encounter (Signed)
Will refill OCP's, script sent. Please inform the patient

## 2016-09-17 NOTE — Telephone Encounter (Signed)
Faxed automatic refill request received from [facility] for LILLOW-28 Last filled by MD on 06/27/16, #84 X 0 RF Last AEX - 11/17/14 Last OV - 09/05/16, JJ Next AEX - 11/28/16 JJ  Patient was not taking OCP on 09/05/16 when she was last seen. Auto-Fill request received from pharmacy.  I spoke to the patient. She states she did not request refill, but if we sent in a refill she would begin taking with next menses.  Please advise refills (#3 x 0 RF). Thank you.

## 2016-09-18 NOTE — Telephone Encounter (Signed)
Patient aware RX has been filled.

## 2016-09-19 ENCOUNTER — Telehealth: Payer: Self-pay

## 2016-09-19 NOTE — Telephone Encounter (Signed)
Medication refill request: lillow Last AEX:  11-23-15 Next AEX: 11-28-16 Last MMG (if hormonal medication request): none Refill authorized: pharmacy requesting 3mth supply. Per last ov with J Jertson,MD pt stopped taking ocp.  Patient was called & unable to leave voicemail. rx denied with note for patient to call office if she wanted to start it back.

## 2016-11-27 ENCOUNTER — Ambulatory Visit: Payer: 59 | Admitting: Nurse Practitioner

## 2016-11-28 ENCOUNTER — Ambulatory Visit: Payer: 59 | Admitting: Obstetrics and Gynecology

## 2016-11-29 ENCOUNTER — Ambulatory Visit (INDEPENDENT_AMBULATORY_CARE_PROVIDER_SITE_OTHER): Payer: 59 | Admitting: Obstetrics and Gynecology

## 2016-11-29 ENCOUNTER — Encounter: Payer: Self-pay | Admitting: Obstetrics and Gynecology

## 2016-11-29 VITALS — BP 100/60 | HR 88 | Resp 16 | Ht 64.0 in | Wt 164.0 lb

## 2016-11-29 DIAGNOSIS — Z0000 Encounter for general adult medical examination without abnormal findings: Secondary | ICD-10-CM

## 2016-11-29 DIAGNOSIS — N898 Other specified noninflammatory disorders of vagina: Secondary | ICD-10-CM

## 2016-11-29 DIAGNOSIS — Z113 Encounter for screening for infections with a predominantly sexual mode of transmission: Secondary | ICD-10-CM | POA: Diagnosis not present

## 2016-11-29 DIAGNOSIS — Z01419 Encounter for gynecological examination (general) (routine) without abnormal findings: Secondary | ICD-10-CM

## 2016-11-29 MED ORDER — VALACYCLOVIR HCL 500 MG PO TABS: 500.0000 mg | ORAL_TABLET | Freq: Every day | ORAL | 4 refills | Status: DC

## 2016-11-29 NOTE — Progress Notes (Signed)
31 y.o. G0P0000 Single Hispanic F here for annual exam.  She c/o a slightly increased white, creamy vaginal d/c, no itching. Seems to come and go. She would like full STD testing.  She is using W/D for contraception, okay if she gets pregnant. Using folic acid. Same partner x 1 year.  Period Cycle (Days): 28 Period Duration (Days): 4-5 days  Period Pattern: Regular Menstrual Flow: Moderate Menstrual Control: Maxi pad Menstrual Control Change Freq (Hours): changes pad every 4 hours  Dysmenorrhea: None  Patient's last menstrual period was 11/13/2016.          Sexually active: Yes.    The current method of family planning is none.    Exercising: Yes.    weights/ cardio  Smoker:  no  Health Maintenance: Pap:  11-17-14 WNL  10-27-13 WNL NEG HR HPV History of abnormal Pap: LGSIL: LGSIL with HR HPV, colpo 03/2011 TDaP:  08-11-15 Gardasil: completed all 3    reports that she has never smoked. She has never used smokeless tobacco. She reports that she drinks about 1.2 oz of alcohol per week . She reports that she does not use drugs. She is a Production designer, theatre/television/film at [employer].   Past Medical History:  Diagnosis Date  . 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 Abnormal Pap smear 02/2010   LSIL, COLPO Neg  . STD (sexually transmitted disease) 06/2009   + chlam   . STD (sexually transmitted disease) 03/29/11   + HSV II proven C&S vulva  . 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 COLPOSCOPY  07/2009   CIN I    Current Outpatient Prescriptions  Medication Sig Dispense Refill  . DENTA 5000 PLUS 1.1 % CREA dental cream See admin instructions.  4  . Flaxseed, Linseed, (FLAX SEED OIL) 1000 MG CAPS Take 1 capsule by mouth daily.    Marland Kitchen FOLIC ACID PO Take by mouth 4 (four) times a week.    . Multiple Vitamin (MULTI-VITAMIN PO) Take by mouth 4 (four) times a week.     . valACYclovir (VALTREX) 1000 MG tablet TAKE 1 TABLET (1,000 MG TOTAL) BY MOUTH 2 (TWO) TIMES DAILY. TAKE FOR 10 DAYS 90 tablet 3  . levonorgestrel-ethinyl estradiol (LILLOW) 0.15-30 MG-MCG tablet Take 1 tablet by mouth daily. (Patient not taking: Reported on 11/29/2016) 3 Package 0   No current facility-administered medications for this visit.     Family History  Problem Relation Age of Onset  . Hypertension Mother   . Hypertension Father     Review of Systems  Constitutional: Negative.   HENT: Negative.   Eyes: Negative.   Respiratory: Negative.   Cardiovascular: Negative.   Gastrointestinal: Negative.   Endocrine: Negative.   Genitourinary: Negative.   Musculoskeletal: Negative.   Skin: Negative.   Allergic/Immunologic: Negative.   Neurological: Negative.   Psychiatric/Behavioral: Negative.     Exam:   BP 100/60 (BP Location: Right Arm, Patient Position: Sitting, Cuff Size: Normal)   Pulse 88   Resp 16   Ht 5\' 4"  (1.626 m)   Wt 164 lb (74.4 kg)   LMP 11/13/2016   BMI 28.15 kg/m   Weight change: @WEIGHTCHANGE @ Height:   Height: 5\' 4"  (162.6 cm)  Ht Readings from Last 3 Encounters:  11/29/16 5\' 4"  (1.626 m)  07/31/16 5\' 4"  (1.626 m)  07/16/16 5\' 4"  (1.626 m)    General appearance: alert, cooperative and appears stated age Head: Normocephalic, without obvious abnormality, atraumatic Neck: no adenopathy, supple, symmetrical, trachea midline and thyroid normal to inspection and palpation Lungs: clear to auscultation bilaterally Cardiovascular: regular rate and rhythm Breasts: normal appearance, no masses or tenderness Abdomen: soft, non-tender; non distended,  no masses,  no organomegaly Extremities: extremities normal, atraumatic, no cyanosis or edema Skin: Skin color, texture, turgor normal. No rashes or lesions Lymph nodes: Cervical, supraclavicular, and axillary nodes normal. No abnormal inguinal nodes palpated Neurologic: Grossly normal   Pelvic: External  genitalia:  no lesions              Urethra:  normal appearing urethra with no masses, tenderness or lesions              Bartholins and Skenes: normal                 Vagina: normal appearing vagina with a slight increase in thick white vaginal d/c, no lesions              Cervix: no lesions               Bimanual Exam:  Uterus:  normal size, contour, position, consistency, mobility, non-tender and anteverted              Adnexa: no mass, fullness, tenderness               Rectovaginal: Confirms               Anus:  normal sphincter tone, no lesions  Chaperone was present for exam.  A:  Well Woman with normal exam  Desires STD testing  Vaginal discharge  P:   STD testing  Affirm  Screening labs  Pap next year  Declines contraception  Discussed breast self exam  Discussed calcium and vit D intake

## 2016-11-29 NOTE — Patient Instructions (Signed)
EXERCISE AND DIET:  We recommended that you start or continue a regular exercise program for good health. Regular exercise means any activity that makes your heart beat faster and makes you sweat.  We recommend exercising at least 30 minutes per day at least 3 days a week, preferably 4 or 5.  We also recommend a diet low in fat and sugar.  Inactivity, poor dietary choices and obesity can cause diabetes, heart attack, stroke, and kidney damage, among others.    ALCOHOL AND SMOKING:  Women should limit their alcohol intake to no more than 7 drinks/beers/glasses of wine (combined, not each!) per week. Moderation of alcohol intake to this level decreases your risk of breast cancer and liver damage. And of course, no recreational drugs are part of a healthy lifestyle.  And absolutely no smoking or even second hand smoke. Most people know smoking can cause heart and lung diseases, but did you know it also contributes to weakening of your bones? Aging of your skin?  Yellowing of your teeth and nails?  CALCIUM AND VITAMIN D:  Adequate intake of calcium and Vitamin D are recommended.  The recommendations for exact amounts of these supplements seem to change often, but generally speaking 600 mg of calcium (either carbonate or citrate) and 800 units of Vitamin D per day seems prudent. Certain women may benefit from higher intake of Vitamin D.  If you are among these women, your doctor will have told you during your visit.    PAP SMEARS:  Pap smears, to check for cervical cancer or precancers,  have traditionally been done yearly, although recent scientific advances have shown that most women can have pap smears less often.  However, every woman still should have a physical exam from her gynecologist every year. It will include a breast check, inspection of the vulva and vagina to check for abnormal growths or skin changes, a visual exam of the cervix, and then an exam to evaluate the size and shape of the uterus and  ovaries.  And after 29 years of age, a rectal exam is indicated to check for rectal cancers. We will also provide age appropriate advice regarding health maintenance, like when you should have certain vaccines, screening for sexually transmitted diseases, bone density testing, colonoscopy, mammograms, etc.   MAMMOGRAMS:  All women over 40 years old should have a yearly mammogram. Many facilities now offer a "3D" mammogram, which may cost around $50 extra out of pocket. If possible,  we recommend you accept the option to have the 3D mammogram performed.  It both reduces the number of women who will be called back for extra views which then turn out to be normal, and it is better than the routine mammogram at detecting truly abnormal areas.    COLONOSCOPY:  Colonoscopy to screen for colon cancer is recommended for all women at age 50.  We know, you hate the idea of the prep.  We agree, BUT, having colon cancer and not knowing it is worse!!  Colon cancer so often starts as a polyp that can be seen and removed at colonscopy, which can quite literally save your life!  And if your first colonoscopy is normal and you have no family history of colon cancer, most women don't have to have it again for 10 years.  Once every ten years, you can do something that may end up saving your life, right?  We will be happy to help you get it scheduled when you are ready.    Be sure to check your insurance coverage so you understand how much it will cost.  It may be covered as a preventative service at no cost, but you should check your particular policy.      Breast Self-Awareness Breast self-awareness means being familiar with how your breasts look and feel. It involves checking your breasts regularly and reporting any changes to your health care provider. Practicing breast self-awareness is important. A change in your breasts can be a sign of a serious medical problem. Being familiar with how your breasts look and feel allows  you to find any problems early, when treatment is more likely to be successful. All women should practice breast self-awareness, including women who have had breast implants. How to do a breast self-exam One way to learn what is normal for your breasts and whether your breasts are changing is to do a breast self-exam. To do a breast self-exam: Look for Changes  1. Remove all the clothing above your waist. 2. Stand in front of a mirror in a room with good lighting. 3. Put your hands on your hips. 4. Push your hands firmly downward. 5. Compare your breasts in the mirror. Look for differences between them (asymmetry), such as: ? Differences in shape. ? Differences in size. ? Puckers, dips, and bumps in one breast and not the other. 6. Look at each breast for changes in your skin, such as: ? Redness. ? Scaly areas. 7. Look for changes in your nipples, such as: ? Discharge. ? Bleeding. ? Dimpling. ? Redness. ? A change in position. Feel for Changes  Carefully feel your breasts for lumps and changes. It is best to do this while lying on your back on the floor and again while sitting or standing in the shower or tub with soapy water on your skin. Feel each breast in the following way:  Place the arm on the side of the breast you are examining above your head.  Feel your breast with the other hand.  Start in the nipple area and make  inch (2 cm) overlapping circles to feel your breast. Use the pads of your three middle fingers to do this. Apply light pressure, then medium pressure, then firm pressure. The light pressure will allow you to feel the tissue closest to the skin. The medium pressure will allow you to feel the tissue that is a little deeper. The firm pressure will allow you to feel the tissue close to the ribs.  Continue the overlapping circles, moving downward over the breast until you feel your ribs below your breast.  Move one finger-width toward the center of the body.  Continue to use the  inch (2 cm) overlapping circles to feel your breast as you move slowly up toward your collarbone.  Continue the up and down exam using all three pressures until you reach your armpit.  Write Down What You Find  Write down what is normal for each breast and any changes that you find. Keep a written record with breast changes or normal findings for each breast. By writing this information down, you do not need to depend only on memory for size, tenderness, or location. Write down where you are in your menstrual cycle, if you are still menstruating. If you are having trouble noticing differences in your breasts, do not get discouraged. With time you will become more familiar with the variations in your breasts and more comfortable with the exam. How often should I examine my breasts? Examine   your breasts every month. If you are breastfeeding, the best time to examine your breasts is after a feeding or after using a breast pump. If you menstruate, the best time to examine your breasts is 5-7 days after your period is over. During your period, your breasts are lumpier, and it may be more difficult to notice changes. When should I see my health care provider? See your health care provider if you notice:  A change in shape or size of your breasts or nipples.  A change in the skin of your breast or nipples, such as a reddened or scaly area.  Unusual discharge from your nipples.  A lump or thick area that was not there before.  Pain in your breasts.  Anything that concerns you.  This information is not intended to replace advice given to you by your health care provider. Make sure you discuss any questions you have with your health care provider. Document Released: 01/22/2005 Document Revised: 06/30/2015 Document Reviewed: 12/12/2014 Elsevier Interactive Patient Education  2018 [employer]  

## 2016-11-30 ENCOUNTER — Other Ambulatory Visit: Payer: Self-pay | Admitting: *Deleted

## 2016-11-30 LAB — COMPREHENSIVE METABOLIC PANEL
A/G RATIO: 1.5 (ref 1.2–2.2)
ALBUMIN: 4.4 g/dL (ref 3.5–5.5)
ALK PHOS: 69 IU/L (ref 39–117)
ALT: 18 IU/L (ref 0–32)
AST: 24 IU/L (ref 0–40)
BILIRUBIN TOTAL: 0.3 mg/dL (ref 0.0–1.2)
BUN / CREAT RATIO: 19 (ref 9–23)
BUN: 18 mg/dL (ref 6–20)
CHLORIDE: 102 mmol/L (ref 96–106)
CO2: 24 mmol/L (ref 20–29)
Calcium: 9.6 mg/dL (ref 8.7–10.2)
Creatinine, Ser: 0.95 mg/dL (ref 0.57–1.00)
GFR calc non Af Amer: 82 mL/min/{1.73_m2} (ref >59)
GFR, EST AFRICAN AMERICAN: 94 mL/min/{1.73_m2} (ref >59)
GLUCOSE: 91 mg/dL (ref 65–99)
Globulin, Total: 3 g/dL (ref 1.5–4.5)
POTASSIUM: 4.3 mmol/L (ref 3.5–5.2)
SODIUM: 140 mmol/L (ref 134–144)
TOTAL PROTEIN: 7.4 g/dL (ref 6.0–8.5)

## 2016-11-30 LAB — LIPID PANEL
CHOLESTEROL TOTAL: 126 mg/dL (ref 100–199)
Chol/HDL Ratio: 1.8 ratio (ref 0.0–4.4)
HDL: 72 mg/dL (ref >39)
LDL Calculated: 40 mg/dL (ref 0–99)
Triglycerides: 69 mg/dL (ref 0–149)
VLDL CHOLESTEROL CAL: 14 mg/dL (ref 5–40)

## 2016-11-30 LAB — CBC
HEMOGLOBIN: 14.9 g/dL (ref 11.1–15.9)
Hematocrit: 43.7 % (ref 34.0–46.6)
MCH: 31.8 pg (ref 26.6–33.0)
MCHC: 34.1 g/dL (ref 31.5–35.7)
MCV: 93 fL (ref 79–97)
PLATELETS: 291 10*3/uL (ref 150–379)
RBC: 4.69 x10E6/uL (ref 3.77–5.28)
RDW: 14.3 % (ref 12.3–15.4)
WBC: 11.3 10*3/uL — ABNORMAL HIGH (ref 3.4–10.8)

## 2016-11-30 LAB — VAGINITIS/VAGINOSIS, DNA PROBE
CANDIDA SPECIES: NEGATIVE
GARDNERELLA VAGINALIS: POSITIVE — AB
TRICHOMONAS VAG: NEGATIVE

## 2016-11-30 LAB — HIV ANTIBODY (ROUTINE TESTING W REFLEX): HIV SCREEN 4TH GENERATION: NONREACTIVE

## 2016-11-30 LAB — RPR: RPR: NONREACTIVE

## 2016-11-30 MED ORDER — METRONIDAZOLE 500 MG PO TABS: 500.0000 mg | ORAL_TABLET | Freq: Two times a day (BID) | ORAL | 0 refills | Status: DC

## 2016-12-01 LAB — GC/CHLAMYDIA PROBE AMP
CHLAMYDIA, DNA PROBE: NEGATIVE
NEISSERIA GONORRHOEAE BY PCR: NEGATIVE

## 2016-12-19 ENCOUNTER — Other Ambulatory Visit: Payer: Self-pay

## 2016-12-19 ENCOUNTER — Ambulatory Visit: Payer: 59 | Admitting: Obstetrics and Gynecology

## 2016-12-19 ENCOUNTER — Encounter: Payer: Self-pay | Admitting: Obstetrics and Gynecology

## 2016-12-19 VITALS — BP 100/70 | HR 80 | Resp 14 | Wt 163.0 lb

## 2016-12-19 DIAGNOSIS — N898 Other specified noninflammatory disorders of vagina: Secondary | ICD-10-CM | POA: Diagnosis not present

## 2016-12-19 NOTE — Progress Notes (Signed)
GYNECOLOGY  VISIT   HPI: 31 y.o.   Single  Hispanic  female   G0P0000 with Patient's last menstrual period was 12/11/2016.   here for BV recheck. She was diagnosed with BV at her recent annual exam and was treated with Flagyl. She is not currently having any specific complaints. She reports that she has an intermittent increase in her vaginal discharge, not consistent. No itching, no odor, no irritation.    GYNECOLOGIC HISTORY: Patient's last menstrual period was 12/11/2016. Contraception:none  Menopausal hormone therapy: none         OB History    Gravida Para Term Preterm AB Living   0 0 0 0 0 0   SAB TAB Ectopic Multiple Live Births   0 0 0 0 0         There are no active problems to display for this patient.   Past Medical History:  Diagnosis Date  . 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 Abnormal Pap smear 02/2010   LSIL, COLPO Neg  . STD (sexually transmitted disease) 06/2009   + chlam   . STD (sexually transmitted disease) 03/29/11   + HSV II proven C&S vulva  . STD (sexually transmitted disease) 04/11/11   + RPR titer, confirmation test neg do T.pallidium test (lab [id number])    Past Surgical History:  Procedure Laterality Date  . COLPOSCOPY  07/2009   CIN I    Current Outpatient Medications  Medication Sig Dispense Refill  . DENTA 5000 PLUS 1.1 % CREA dental cream See admin instructions.  4  . Flaxseed, Linseed, (FLAX SEED OIL) 1000 MG CAPS Take 1 capsule by mouth daily.    Marland Kitchen. FOLIC ACID PO Take by mouth 4 (four) times a week.    . Multiple Vitamin (MULTI-VITAMIN PO) Take by mouth 4 (four) times a week.    . valACYclovir (VALTREX) 500 MG tablet Take 1 tablet (500 mg total) by mouth daily. Can increase to BID for 3 days as needed 90 tablet 4   No current facility-administered medications for this visit.      ALLERGIES: Patient has no known allergies.  Family History  Problem Relation Age of Onset  . Hypertension Mother   . Hypertension  Father     Social History   Socioeconomic History  . Marital status: Single    Spouse name: Not on file  . Number of children: Not on file  . Years of education: Not on file  . Highest education level: Not on file  Social Needs  . Financial resource strain: Not on file  . Food insecurity - worry: Not on file  . Food insecurity - inability: Not on file  . Transportation needs - medical: Not on file  . Transportation needs - non-medical: Not on file  Occupational History  . Not on file  Tobacco Use  . Smoking status: Never Smoker  . Smokeless tobacco: Never Used  Substance and Sexual Activity  . Alcohol use: Yes    Alcohol/week: 1.2 oz    Types: 2 Standard drinks or equivalent per week  . Drug use: No  . Sexual activity: Yes    Partners: Male    Birth control/protection: Pill  Other Topics Concern  . Not on file  Social History Narrative  . Not on file    Review of Systems  Constitutional: Negative.   HENT: Negative.   Eyes: Negative.   Respiratory: Negative.   Cardiovascular:  Negative.   Gastrointestinal: Negative.   Genitourinary: Negative.   Musculoskeletal: Negative.   Skin: Negative.   Neurological: Negative.   Endo/Heme/Allergies: Negative.   Psychiatric/Behavioral: Negative.     PHYSICAL EXAMINATION:    BP 100/70 (BP Location: Right Arm, Patient Position: Sitting, Cuff Size: Normal)   Pulse 80   Resp 14   Wt 163 lb (73.9 kg)   LMP 12/11/2016   BMI 27.98 kg/m     General appearance: alert, cooperative and appears stated age  Pelvic: External genitalia:  no lesions              Urethra:  normal appearing urethra with no masses, tenderness or lesions              Bartholins and Skenes: normal                 Vagina: normal appearing vagina with normal color and discharge, no lesions              Cervix: no lesions  Chaperone was present for exam.  ASSESSMENT Recently treated for BV, desires re testing. She has an intermittent increase in  vaginal d/c. Normal exam    PLAN Affirm sent Discussed that we don't typically treat BV or yeast if it is not symptomatic Discussed the normal flora of the vagina and that condom use may decrease the risk of recurrent BV   An After Visit Summary was printed and given to the patient.  10 minutes face to face time of which over 50% was spent in counseling.

## 2016-12-20 LAB — VAGINITIS/VAGINOSIS, DNA PROBE
Candida Species: NEGATIVE
Gardnerella vaginalis: NEGATIVE
TRICHOMONAS VAG: NEGATIVE

## 2016-12-22 ENCOUNTER — Other Ambulatory Visit: Payer: Self-pay | Admitting: Obstetrics and Gynecology

## 2017-06-03 ENCOUNTER — Telehealth: Payer: Self-pay | Admitting: Obstetrics and Gynecology

## 2017-06-03 NOTE — Telephone Encounter (Signed)
Spoke with patient. LMP was 05/06/2017. Took UPT 4/27 and 4/28 both were positive. Would like to come in for confirmation. Appointment scheduled for 06/05/2017 at 8:15 am with Dr.Jertson. Patient is agreeable to date and time. Denies any current problems or concerns.  Routing to provider for final review. Patient agreeable to disposition. Will close encounter.

## 2017-06-03 NOTE — Telephone Encounter (Signed)
Patient would like to come in for pregnancy confirmation. 

## 2017-06-05 ENCOUNTER — Encounter: Payer: Self-pay | Admitting: Obstetrics and Gynecology

## 2017-06-05 ENCOUNTER — Telehealth: Payer: Self-pay | Admitting: Obstetrics and Gynecology

## 2017-06-05 ENCOUNTER — Ambulatory Visit: Payer: 59 | Admitting: Obstetrics and Gynecology

## 2017-06-05 ENCOUNTER — Other Ambulatory Visit: Payer: Self-pay

## 2017-06-05 VITALS — BP 124/72 | HR 72 | Resp 14 | Wt 166.0 lb

## 2017-06-05 DIAGNOSIS — N926 Irregular menstruation, unspecified: Secondary | ICD-10-CM

## 2017-06-05 DIAGNOSIS — Z3201 Encounter for pregnancy test, result positive: Secondary | ICD-10-CM

## 2017-06-05 DIAGNOSIS — Z8619 Personal history of other infectious and parasitic diseases: Secondary | ICD-10-CM | POA: Diagnosis not present

## 2017-06-05 LAB — POCT URINE PREGNANCY: Preg Test, Ur: POSITIVE — AB

## 2017-06-05 NOTE — Patient Instructions (Signed)

## 2017-06-05 NOTE — Telephone Encounter (Signed)
Patient has a question about what she can drink during pregnancy.

## 2017-06-05 NOTE — Telephone Encounter (Signed)
Spoke with patient. Positive pregnancy test today while in office. Discussed safe foods during pregnancy.   Patient started "Clean Juice" cleanse today. This is pre bottled, cold pressed, unpasteurized juice. Patient asking if this is safe during pregnancy?   Advised Dr. Oscar La will review, I will return call with recommendations.   Dr. Oscar La -please advise.

## 2017-06-05 NOTE — Progress Notes (Signed)
GYNECOLOGY  VISIT   HPI: 32 y.o.   Single  Hispanic  female   G0P0000 with Patient's last menstrual period was 05/06/2017.   here for pregnancy confirmation. Cycles are every 28-29 days. [facility] 02/10/18. +UPT this weekend. Feeling fine.  She has been taking prenatal vitamins most days. Also taking folic acid.   She has a h/o chlamydia. H/O HSV, on valtrex.  GYNECOLOGIC HISTORY: Patient's last menstrual period was 05/06/2017. Contraception:none  Menopausal hormone therapy: none         OB History    Gravida  0   Para  0   Term  0   Preterm  0   AB  0   Living  0     SAB  0   TAB  0   Ectopic  0   Multiple  0   Live Births  0              There are no active problems to display for this patient.   Past Medical History:  Diagnosis Date  . 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 Abnormal Pap smear 02/2010   LSIL, COLPO Neg  . STD (sexually transmitted disease) 06/2009   + chlam   . STD (sexually transmitted disease) 03/29/11   + HSV II proven C&S vulva  . 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 COLPOSCOPY  07/2009   CIN I    Current Outpatient Medications  Medication Sig Dispense Refill  . DENTA 5000 PLUS 1.1 % CREA dental cream See admin instructions.  4  . FOLIC ACID PO Take by mouth 4 (four) times a week.    . Prenatal Multivit-Min-Fe-FA (PRE-NATAL FORMULA PO) Take by mouth.    . valACYclovir (VALTREX) 500 MG tablet Take 1 tablet (500 mg total) by mouth daily. Can increase to BID for 3 days as needed 90 tablet 4   No current facility-administered medications for this visit.      ALLERGIES: Patient has no known allergies.  Family History  Problem Relation Age of Onset  . Hypertension Mother   . Hypertension Father     Social History   Socioeconomic History  . Marital status: Single    Spouse  name: Not on file  . Number of children: Not on file  . Years of education: Not on file  . Highest education level: Not on file  Occupational History  . Not on file  Social Needs  . Financial resource strain: Not on file  . Food insecurity:    Worry: Not on file    Inability: Not on file  . Transportation needs:    Medical: Not on file    Non-medical: Not on file  Tobacco Use  . Smoking status: Never Smoker  . Smokeless tobacco: Never Used  Substance and Sexual Activity  . Alcohol use: Yes    Alcohol/week: 1.2 oz    Types: 2 Standard drinks or equivalent per week  . Drug use: No  . Sexual activity: Yes    Partners: Male    Birth control/protection: Pill  Lifestyle  . Physical activity:    Days per week: Not on file    Minutes per session: Not on file  . Stress: Not on file  Relationships  . Social connections:    Talks on  phone: Not on file    Gets together: Not on file    Attends religious service: Not on file    Active member of club or organization: Not on file    Attends meetings of clubs or organizations: Not on file    Relationship status: Not on file  . Intimate partner violence:    Fear of current or ex partner: Not on file    Emotionally abused: Not on file    Physically abused: Not on file    Forced sexual activity: Not on file  Other Topics Concern  . Not on file  Social History Narrative  . Not on file    Review of Systems  Constitutional: Negative.   HENT: Negative.   Eyes: Negative.   Respiratory: Negative.   Cardiovascular: Negative.   Gastrointestinal: Negative.   Genitourinary: Negative.   Musculoskeletal: Negative.   Skin: Negative.   Neurological: Negative.   Endo/Heme/Allergies: Negative.   Psychiatric/Behavioral: Negative.     PHYSICAL EXAMINATION:    BP 124/72 (BP Location: Right Arm, Patient Position: Sitting, Cuff Size: Normal)   Pulse 72   Resp 14   Wt 166 lb (75.3 kg)   LMP 05/06/2017   BMI 28.49 kg/m     General  appearance: alert, cooperative and appears stated age  ASSESSMENT Missed menses, +UPT, 4+2 weeks based on definite LMP H/O chlamydia, needs BhcG's until level is high enough to confirm IUP H/O HSV, on Valtrex, cat B drug.     PLAN BhcG today and serially until 2000, then will set up an ultrasound Discussed risk of ectopic and warning signs (on the phone after her appointment for privacy, partner was present at her visit) Discussed avoiding ETOH, medications, certain foods Discussed exercise in pregnancy Ob #'s given Questions answered Discussed HSV and delivery. She is on Valtrex, wishes to continue. Cat B, fine to continue for now, she can further discuss with her OB   An After Visit Summary was printed and given to the patient.  ~20 minutes face to face time of which over 50% was spent in counseling.

## 2017-06-05 NOTE — Telephone Encounter (Signed)
No, she shouldn't have anything that isn't pasteurized.

## 2017-06-06 ENCOUNTER — Telehealth: Payer: Self-pay | Admitting: *Deleted

## 2017-06-06 DIAGNOSIS — Z3201 Encounter for pregnancy test, result positive: Secondary | ICD-10-CM

## 2017-06-06 DIAGNOSIS — N926 Irregular menstruation, unspecified: Secondary | ICD-10-CM

## 2017-06-06 LAB — BETA HCG QUANT (REF LAB): hCG Quant: 481 m[IU]/mL

## 2017-06-06 NOTE — Telephone Encounter (Signed)
-----   Message from Romualdo Bolk, MD sent at 06/06/2017  8:05 AM EDT ----- Please let the patient know that her BhcG is off to a great start. She needs another BhcG sent off stat tomorrow morning (want the results prior to the weekend). Please set her up for the blood work in the am and make sure that you get the results and review it with me (if I'm still here), or one of the other doctors.

## 2017-06-06 NOTE — Telephone Encounter (Signed)
Notes recorded by Leda Min, RN on 06/06/2017 at 9:46 AM EDT Left message to call Noreene Larsson at [phone].

## 2017-06-06 NOTE — Telephone Encounter (Signed)
Spoke with patient, advised as seen below per Dr. Oscar La. Lab appointment scheduled for 5/3 at 9:45am.   Future lab order placed for STAT Beta hcg quant.   Will close encounter.

## 2017-06-06 NOTE — Telephone Encounter (Signed)
Spoke with patient, advised as seen below per Dr. Oscar La. Patient states she did drink 2 bottles of the juice on 5/1, will stop "clean juice". Is aware to return call if any additional concerns.   Routing to provider for final review. Patient is agreeable to disposition. Will close encounter.

## 2017-06-07 ENCOUNTER — Other Ambulatory Visit: Payer: 59

## 2017-06-07 ENCOUNTER — Telehealth: Payer: Self-pay | Admitting: *Deleted

## 2017-06-07 DIAGNOSIS — Z3201 Encounter for pregnancy test, result positive: Secondary | ICD-10-CM

## 2017-06-07 DIAGNOSIS — Z3A01 Less than 8 weeks gestation of pregnancy: Secondary | ICD-10-CM

## 2017-06-07 DIAGNOSIS — N926 Irregular menstruation, unspecified: Secondary | ICD-10-CM

## 2017-06-07 LAB — BETA HCG QUANT (REF LAB): hCG Quant: 1149 m[IU]/mL

## 2017-06-07 NOTE — Telephone Encounter (Signed)
Phone report of stat HCG to Dr Oscar La. Orders received:, Notify patient of appropriate increase, repeat level on Monday and ultrasound on Tuesday.  Call to patient. Advised of results and instructions for follow-up. Appointments scheduled as ordered.   Routing to provider for final review. Patient agreeable to disposition. Will close encounter.

## 2017-06-10 ENCOUNTER — Other Ambulatory Visit: Payer: 59

## 2017-06-10 DIAGNOSIS — Z3A01 Less than 8 weeks gestation of pregnancy: Secondary | ICD-10-CM

## 2017-06-10 LAB — BETA HCG QUANT (REF LAB): HCG QUANT: 4108 m[IU]/mL

## 2017-06-11 ENCOUNTER — Ambulatory Visit (INDEPENDENT_AMBULATORY_CARE_PROVIDER_SITE_OTHER): Payer: 59 | Admitting: Obstetrics and Gynecology

## 2017-06-11 ENCOUNTER — Other Ambulatory Visit: Payer: Self-pay

## 2017-06-11 ENCOUNTER — Encounter: Payer: Self-pay | Admitting: Obstetrics and Gynecology

## 2017-06-11 ENCOUNTER — Ambulatory Visit (INDEPENDENT_AMBULATORY_CARE_PROVIDER_SITE_OTHER): Payer: 59

## 2017-06-11 VITALS — BP 102/60 | HR 72 | Resp 14 | Wt 165.0 lb

## 2017-06-11 DIAGNOSIS — Z3A01 Less than 8 weeks gestation of pregnancy: Secondary | ICD-10-CM | POA: Diagnosis not present

## 2017-06-11 DIAGNOSIS — Z8619 Personal history of other infectious and parasitic diseases: Secondary | ICD-10-CM

## 2017-06-11 NOTE — Progress Notes (Signed)
GYNECOLOGY  VISIT   HPI: 32 y.o.   Single  Hispanic  female   G0P0000 with Patient's last menstrual period was 05/06/2017.   here for F/U confirm IUP, patient with a h/o chlamydia. No c/o.   GYNECOLOGIC HISTORY: Patient's last menstrual period was 05/06/2017. Contraception:none Menopausal hormone therapy: none         OB History    Gravida  0   Para  0   Term  0   Preterm  0   AB  0   Living  0     SAB  0   TAB  0   Ectopic  0   Multiple  0   Live Births  0              There are no active problems to display for this patient.   Past Medical History:  Diagnosis Date  . 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 Abnormal Pap smear 02/2010   LSIL, COLPO Neg  . STD (sexually transmitted disease) 06/2009   + chlam   . STD (sexually transmitted disease) 03/29/11   + HSV II proven C&S vulva  . STD (sexually transmitted disease) 04/11/11   + RPR titer, confirmation test neg do T.pallidium test (lab [id number])    Past Surgical History:  Procedure Laterality Date  . COLPOSCOPY  07/2009   CIN I    Current Outpatient Medications  Medication Sig Dispense Refill  . DENTA 5000 PLUS 1.1 % CREA dental cream See admin instructions.  4  . Prenatal Multivit-Min-Fe-FA (PRE-NATAL FORMULA PO) Take by mouth.    . valACYclovir (VALTREX) 500 MG tablet Take 1 tablet (500 mg total) by mouth daily. Can increase to BID for 3 days as needed 90 tablet 4  . FOLIC ACID PO Take by mouth 4 (four) times a week.     No current facility-administered medications for this visit.      ALLERGIES: Patient has no known allergies.  Family History  Problem Relation Age of Onset  . Hypertension Mother   . Hypertension Father     Social History   Socioeconomic History  . Marital status: Single    Spouse name: Not on file  . Number of children: Not on file  . Years of education: Not on file  . Highest education level: Not on file  Occupational History  .  Not on file  Social Needs  . Financial resource strain: Not on file  . Food insecurity:    Worry: Not on file    Inability: Not on file  . Transportation needs:    Medical: Not on file    Non-medical: Not on file  Tobacco Use  . Smoking status: Never Smoker  . Smokeless tobacco: Never Used  Substance and Sexual Activity  . Alcohol use: Yes    Alcohol/week: 1.2 oz    Types: 2 Standard drinks or equivalent per week  . Drug use: No  . Sexual activity: Yes    Partners: Male    Birth control/protection: Pill  Lifestyle  . Physical activity:    Days per week: Not on file    Minutes per session: Not on file  . Stress: Not on file  Relationships  . Social connections:    Talks on phone: Not on file    Gets together: Not on file    Attends religious service: Not on file    Active member  of club or organization: Not on file    Attends meetings of clubs or organizations: Not on file    Relationship status: Not on file  . Intimate partner violence:    Fear of current or ex partner: Not on file    Emotionally abused: Not on file    Physically abused: Not on file    Forced sexual activity: Not on file  Other Topics Concern  . Not on file  Social History Narrative  . Not on file    Review of Systems  Constitutional: Negative.   HENT: Negative.   Eyes: Negative.   Respiratory: Negative.   Cardiovascular: Negative.   Gastrointestinal: Negative.   Genitourinary: Negative.   Musculoskeletal: Negative.   Skin: Negative.   Neurological: Negative.   Endo/Heme/Allergies: Negative.   Psychiatric/Behavioral: Negative.     PHYSICAL EXAMINATION:    BP 102/60 (BP Location: Right Arm, Patient Position: Sitting, Cuff Size: Normal)   Pulse 72   Resp 14   Wt 165 lb (74.8 kg)   LMP 05/06/2017   BMI 28.32 kg/m     General appearance: alert, cooperative and appears stated age  Reviewed ultrasound images with the patient  ASSESSMENT Early IUP    PLAN F/U with OB Answered her  questions about exercise and food Call with any concerns   An After Visit Summary was printed and given to the patient.

## 2017-06-20 ENCOUNTER — Ambulatory Visit: Payer: 59 | Admitting: Certified Nurse Midwife

## 2017-06-20 ENCOUNTER — Ambulatory Visit (INDEPENDENT_AMBULATORY_CARE_PROVIDER_SITE_OTHER): Payer: 59

## 2017-06-20 ENCOUNTER — Encounter: Payer: Self-pay | Admitting: Obstetrics and Gynecology

## 2017-06-20 ENCOUNTER — Ambulatory Visit: Payer: 59 | Admitting: Obstetrics and Gynecology

## 2017-06-20 ENCOUNTER — Telehealth: Payer: Self-pay | Admitting: *Deleted

## 2017-06-20 ENCOUNTER — Other Ambulatory Visit: Payer: Self-pay

## 2017-06-20 VITALS — BP 110/60 | HR 80 | Resp 14 | Wt 165.0 lb

## 2017-06-20 DIAGNOSIS — O26851 Spotting complicating pregnancy, first trimester: Secondary | ICD-10-CM

## 2017-06-20 DIAGNOSIS — B373 Candidiasis of vulva and vagina: Secondary | ICD-10-CM | POA: Diagnosis not present

## 2017-06-20 DIAGNOSIS — Z113 Encounter for screening for infections with a predominantly sexual mode of transmission: Secondary | ICD-10-CM

## 2017-06-20 DIAGNOSIS — B3731 Acute candidiasis of vulva and vagina: Secondary | ICD-10-CM

## 2017-06-20 NOTE — Progress Notes (Signed)
GYNECOLOGY  VISIT   HPI: 32 y.o.   Single  Hispanic  female   G1P0000 with Patient's last menstrual period was 05/06/2017.   here c/o spotting X 7 days with pregnancy. She is also wanting STD testing -eh   The patient had an ultrasound 9 days ago that showed a gestational sac and yolk sac in the uterine cavity. She started spotting 6 days ago, ended on Monday and started again last night. Spotting is very light, notices it when she wipes. No cramping. Itching started today, she hasn't noticed an increase in vaginal d/c.  She would like testing for GC/CT, declines blood work.  GYNECOLOGIC HISTORY: Patient's last menstrual period was 05/06/2017. Contraception:none- pregnant  Menopausal hormone therapy: none         OB History    Gravida  1   Para  0   Term  0   Preterm  0   AB  0   Living  0     SAB  0   TAB  0   Ectopic  0   Multiple  0   Live Births  0              There are no active problems to display for this patient.   Past Medical History:  Diagnosis Date  . 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 Abnormal Pap smear 02/2010   LSIL, COLPO Neg  . STD (sexually transmitted disease) 06/2009   + chlam   . STD (sexually transmitted disease) 03/29/11   + HSV II proven C&S vulva  . STD (sexually transmitted disease) 04/11/11   + RPR titer, confirmation test neg do T.pallidium test (lab [id number])    Past Surgical History:  Procedure Laterality Date  . COLPOSCOPY  07/2009   CIN I    Current Outpatient Medications  Medication Sig Dispense Refill  . FOLIC ACID PO Take by mouth 4 (four) times a week.    . Prenatal Multivit-Min-Fe-FA (PRE-NATAL FORMULA PO) Take by mouth.    . valACYclovir (VALTREX) 500 MG tablet Take 1 tablet (500 mg total) by mouth daily. Can increase to BID for 3 days as needed 90 tablet 4  . DENTA 5000 PLUS 1.1 % CREA dental cream See admin instructions.  4   No current facility-administered medications for this  visit.      ALLERGIES: Patient has no known allergies.  Family History  Problem Relation Age of Onset  . Hypertension Mother   . Hypertension Father     Social History   Socioeconomic History  . Marital status: Single    Spouse name: Not on file  . Number of children: Not on file  . Years of education: Not on file  . Highest education level: Not on file  Occupational History  . Not on file  Social Needs  . Financial resource strain: Not on file  . Food insecurity:    Worry: Not on file    Inability: Not on file  . Transportation needs:    Medical: Not on file    Non-medical: Not on file  Tobacco Use  . Smoking status: Never Smoker  . Smokeless tobacco: Never Used  Substance and Sexual Activity  . Alcohol use: Yes    Alcohol/week: 1.2 oz    Types: 2 Standard drinks or equivalent per week  . Drug use: No  . Sexual activity: Yes    Partners: Male  Birth control/protection: Pill  Lifestyle  . Physical activity:    Days per week: Not on file    Minutes per session: Not on file  . Stress: Not on file  Relationships  . Social connections:    Talks on phone: Not on file    Gets together: Not on file    Attends religious service: Not on file    Active member of club or organization: Not on file    Attends meetings of clubs or organizations: Not on file    Relationship status: Not on file  . Intimate partner violence:    Fear of current or ex partner: Not on file    Emotionally abused: Not on file    Physically abused: Not on file    Forced sexual activity: Not on file  Other Topics Concern  . Not on file  Social History Narrative  . Not on file    Review of Systems  Constitutional: Negative.   HENT: Negative.   Eyes: Negative.   Respiratory: Negative.   Cardiovascular: Negative.   Gastrointestinal: Negative.   Genitourinary:       Spotting with pregnancy   Musculoskeletal: Negative.   Skin: Negative.   Neurological: Negative.   Endo/Heme/Allergies:  Negative.   Psychiatric/Behavioral: Negative.     PHYSICAL EXAMINATION:    BP 110/60 (BP Location: Right Arm, Patient Position: Sitting, Cuff Size: Normal)   Pulse 80   Resp 14   Wt 165 lb (74.8 kg)   LMP 05/06/2017   BMI 28.32 kg/m     General appearance: alert, cooperative and appears stated age Abdomen: soft, non-tender; non distended, no masses,  no organomegaly  Pelvic: External genitalia:  no lesions              Urethra:  normal appearing urethra with no masses, tenderness or lesions              Bartholins and Skenes: normal                 Vagina: normal appearing vagina with, slight amount of brown d/c and thick, clumpy white vaginal d/c              Cervix: no lesions and closed              Bimanual Exam:  Uterus:  normal size, contour, position, consistency, mobility, non-tender              Adnexa: no mass, fullness, tenderness               Chaperone was present for exam.  Wet prep: no clue, no trich, ++ wbc KOH: ++ yeast PH: 4.5  Ultrasound: viable IUP, no abnormalities noted.   ASSESSMENT First trimester spotting, viable IUP Yeast vaginitis Screening STD, only desires cervical cultures    PLAN Patient reassured Needs T&S, if RH- should have rhogam (will need to have patient come back in the am for this). Order placed.  Treat yeast with over the counter miconazole or clotrimazole     An After Visit Summary was printed and given to the patient.

## 2017-06-20 NOTE — Telephone Encounter (Signed)
Call to patient. Advised of orders for lab tests and appointment scheduled for 0830.    Encounter closed.

## 2017-06-20 NOTE — Patient Instructions (Signed)
Vaginal Yeast infection, Adult Vaginal yeast infection is a condition that causes soreness, swelling, and redness (inflammation) of the vagina. It also causes vaginal discharge. This is a common condition. Some women get this infection frequently. What are the causes? This condition is caused by a change in the normal balance of the yeast (candida) and bacteria that live in the vagina. This change causes an overgrowth of yeast, which causes the inflammation. What increases the risk? This condition is more likely to develop in:  Women who take antibiotic medicines.  Women who have diabetes.  Women who take birth control pills.  Women who are pregnant.  Women who douche often.  Women who have a weak defense (immune) system.  Women who have been taking steroid medicines for a long time.  Women who frequently wear tight clothing.  What are the signs or symptoms? Symptoms of this condition include:  White, thick vaginal discharge.  Swelling, itching, redness, and irritation of the vagina. The lips of the vagina (vulva) may be affected as well.  Pain or a burning feeling while urinating.  Pain during sex.  How is this diagnosed? This condition is diagnosed with a medical history and physical exam. This will include a pelvic exam. Your health care provider will examine a sample of your vaginal discharge under a microscope. Your health care provider may send this sample for testing to confirm the diagnosis. How is this treated? This condition is treated with medicine. Medicines may be over-the-counter or prescription. You may be told to use one or more of the following:  Medicine that is taken orally.  Medicine that is applied as a cream.  Medicine that is inserted directly into the vagina (suppository).  Follow these instructions at home:  Take or apply over-the-counter and prescription medicines only as told by your health care provider.  Do not have sex until your health  care provider has approved. Tell your sex partner that you have a yeast infection. That person should go to his or her health care provider if he or she develops symptoms.  Do not wear tight clothes, such as pantyhose or tight pants.  Avoid using tampons until your health care provider approves.  Eat more yogurt. This may help to keep your yeast infection from returning.  Try taking a sitz bath to help with discomfort. This is a warm water bath that is taken while you are sitting down. The water should only come up to your hips and should cover your buttocks. Do this 3-4 times per day or as told by your health care provider.  Do not douche.  Wear breathable, cotton underwear.  If you have diabetes, keep your blood sugar levels under control. Contact a health care provider if:  You have a fever.  Your symptoms go away and then return.  Your symptoms do not get better with treatment.  Your symptoms get worse.  You have new symptoms.  You develop blisters in or around your vagina.  You have blood coming from your vagina and it is not your menstrual period.  You develop pain in your abdomen. This information is not intended to replace advice given to you by your health care provider. Make sure you discuss any questions you have with your health care provider. Document Released: 11/01/2004 Document Revised: 07/06/2015 Document Reviewed: 07/26/2014 Elsevier Interactive Patient Education  2018 [employer].  You can use over the counter miconazole or clotrimazole cream for 7 days (monistat or gynelotrimin

## 2017-06-21 ENCOUNTER — Telehealth: Payer: Self-pay | Admitting: *Deleted

## 2017-06-21 ENCOUNTER — Other Ambulatory Visit (INDEPENDENT_AMBULATORY_CARE_PROVIDER_SITE_OTHER): Payer: 59

## 2017-06-21 DIAGNOSIS — O26851 Spotting complicating pregnancy, first trimester: Secondary | ICD-10-CM

## 2017-06-21 LAB — GC/CHLAMYDIA PROBE AMP
CHLAMYDIA, DNA PROBE: NEGATIVE
NEISSERIA GONORRHOEAE BY PCR: NEGATIVE

## 2017-06-21 LAB — ABO AND RH: Rh Factor: POSITIVE

## 2017-06-21 NOTE — Telephone Encounter (Signed)
Spoke with patient, advised as seen below per Dr. Miller. Patient verbalizes understanding and is agreeable, will close encounter.   

## 2017-06-21 NOTE — Telephone Encounter (Signed)
Call to patient. Advised GC/ CHL culture result from yesterday is negative. Patient states bleeding is very light today and may have actually stopped now.   Routing to Dr Hyacinth Meeker, covering for Dr Oscar La.  Encounter closed.   CC: Dr Oscar La

## 2017-06-21 NOTE — Telephone Encounter (Signed)
Please let pt know her blood type is O+.  She does not need Rhogam.  Thanks.  Cc:  Levada Dy.

## 2017-06-21 NOTE — Telephone Encounter (Signed)
[facility] Calling with STAT lab.  ABO & RH: O positive  Routing to covering provider to review.   Cc: Dr. Oscar La

## 2017-07-29 ENCOUNTER — Encounter: Payer: Self-pay | Admitting: Family Medicine

## 2017-07-29 ENCOUNTER — Ambulatory Visit (INDEPENDENT_AMBULATORY_CARE_PROVIDER_SITE_OTHER): Payer: 59 | Admitting: Family Medicine

## 2017-07-29 DIAGNOSIS — Z124 Encounter for screening for malignant neoplasm of cervix: Secondary | ICD-10-CM

## 2017-07-29 DIAGNOSIS — Z113 Encounter for screening for infections with a predominantly sexual mode of transmission: Secondary | ICD-10-CM

## 2017-07-29 DIAGNOSIS — Z3401 Encounter for supervision of normal first pregnancy, first trimester: Secondary | ICD-10-CM

## 2017-07-29 DIAGNOSIS — Z3400 Encounter for supervision of normal first pregnancy, unspecified trimester: Secondary | ICD-10-CM | POA: Insufficient documentation

## 2017-07-29 NOTE — Progress Notes (Signed)
Subjective:  Tonya Douglas is a G1P0000 4712w0d being seen today for her first obstetrical visit.  Her obstetrical history is significant for first pregnancy. Planned pregnancy. Patient is in stable relationship with FOB (boyfriend) - Jamee. Patient does intend to breast feed. Pregnancy history fully reviewed.  Patient reports no complaints.  BP 127/68   Pulse 80   Wt 171 lb (77.6 kg)   LMP 05/06/2017   BMI 29.35 kg/m   HISTORY: OB History  Gravida Para Term Preterm AB Living  1 0 0 0 0 0  SAB TAB Ectopic Multiple Live Births  0 0 0 0 0    # Outcome Date GA Lbr Len/2nd Weight Sex Delivery Anes PTL Lv  1 Current             Past Medical History:  Diagnosis Date  . 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 Abnormal Pap smear 02/2010   LSIL, COLPO Neg  . STD (sexually transmitted disease) 06/2009   + chlam   . STD (sexually transmitted disease) 03/29/11   + HSV II proven C&S vulva  . STD (sexually transmitted disease) 04/11/11   + RPR titer, confirmation test neg do T.pallidium test (lab [id number])    Past Surgical History:  Procedure Laterality Date  . COLPOSCOPY  07/2009   CIN I    Family History  Problem Relation Age of Onset  . Hypertension Mother   . Hypertension Father      Exam    Uterus:     Pelvic Exam:    Perineum: No Hemorrhoids, Normal Perineum   Vulva: normal, Bartholin's, Urethra, Skene's normal   Vagina:  normal mucosa   Cervix: no bleeding following Pap, no cervical motion tenderness, no lesions and nulliparous appearance   Adnexa: normal adnexa and no mass, fullness, tenderness   Bony Pelvis: gynecoid  System: Breast:  normal appearance, no masses or tenderness, Inspection negative, No nipple retraction or dimpling, No nipple discharge or bleeding, No axillary or supraclavicular adenopathy, Normal to palpation without dominant masses   Skin: normal coloration and turgor, no rashes    Neurologic: oriented, normal, gait normal;  reflexes normal and symmetric   Extremities: normal strength, tone, and muscle mass   HEENT PERRLA and extra ocular movement intact   Mouth/Teeth mucous membranes moist, pharynx normal without lesions   Neck supple and no masses   Cardiovascular: regular rate and rhythm, no murmurs or gallops   Respiratory:  appears well, vitals normal, no respiratory distress, acyanotic, normal RR, ear and throat exam is normal, neck free of mass or lymphadenopathy, chest clear, no wheezing, crepitations, rhonchi, normal symmetric air entry   Abdomen: soft, non-tender; bowel sounds normal; no masses,  no organomegaly   Urinary: urethral meatus normal      Assessment:    Pregnancy: G1P0000 Patient Active Problem List   Diagnosis Date Noted  . Supervision of normal first pregnancy, antepartum 07/29/2017      Plan:   1. Supervision of normal first pregnancy, antepartum Genetic Screening discussed NIPS vs first screen vs Quad. Patient would like Panorama, but wants to check with insurance to see if covered.  Ultrasound discussed; fetal survey: requested.  Discussed nature of practice, use of midwives and fellows, [facility].   Follow up in 4 weeks.  - CHL AMB BABYSCRIPTS OPT IN - Urine cytology ancillary only - Culture, OB Urine - Sickle Cell Scr - Obstetric Panel, Including HIV -  Cytology - PAP  Problem list reviewed and updated. 75% of 45 min visit spent on counseling and coordination of care.    Levie Heritage 07/29/2017

## 2017-07-30 LAB — URINE CYTOLOGY ANCILLARY ONLY
Chlamydia: NEGATIVE
Neisseria Gonorrhea: NEGATIVE

## 2017-07-30 LAB — CYTOLOGY - PAP: Diagnosis: NEGATIVE

## 2017-07-31 LAB — OBSTETRIC PANEL, INCLUDING HIV
Antibody Screen: NEGATIVE
BASOS ABS: 0 10*3/uL (ref 0.0–0.2)
Basos: 0 %
EOS (ABSOLUTE): 0.2 10*3/uL (ref 0.0–0.4)
Eos: 1 %
HIV Screen 4th Generation wRfx: NONREACTIVE
Hematocrit: 42.1 % (ref 34.0–46.6)
Hemoglobin: 13.4 g/dL (ref 11.1–15.9)
Hepatitis B Surface Ag: NEGATIVE
Immature Grans (Abs): 0.1 10*3/uL (ref 0.0–0.1)
Immature Granulocytes: 1 %
LYMPHS ABS: 2.8 10*3/uL (ref 0.7–3.1)
Lymphs: 25 %
MCH: 30.5 pg (ref 26.6–33.0)
MCHC: 31.8 g/dL (ref 31.5–35.7)
MCV: 96 fL (ref 79–97)
MONOS ABS: 0.5 10*3/uL (ref 0.1–0.9)
Monocytes: 4 %
NEUTROS PCT: 69 %
Neutrophils Absolute: 7.8 10*3/uL — ABNORMAL HIGH (ref 1.4–7.0)
PLATELETS: 302 10*3/uL (ref 150–450)
RBC: 4.39 x10E6/uL (ref 3.77–5.28)
RDW: 14.2 % (ref 12.3–15.4)
RH TYPE: POSITIVE
RPR Ser Ql: NONREACTIVE
Rubella Antibodies, IGG: <0.9 index — ABNORMAL LOW (ref >0.99)
WBC: 11.4 10*3/uL — AB (ref 3.4–10.8)

## 2017-07-31 LAB — URINE CULTURE, OB REFLEX

## 2017-07-31 LAB — CULTURE, OB URINE

## 2017-07-31 LAB — SICKLE CELL SCREEN: Sickle Cell Screen: NEGATIVE

## 2017-08-05 ENCOUNTER — Telehealth: Payer: Self-pay

## 2017-08-05 NOTE — Telephone Encounter (Signed)
Pt called the office to see if an authorization to do genetic testing was sent in to her insurance company. Pt states that she has been getting emails from [employer] but has not replied because she thought it was a scam. Pt advised to contact [employer] to see if her screening is there and to call our office back. Understanding was voiced.

## 2017-08-20 ENCOUNTER — Telehealth: Payer: Self-pay

## 2017-08-20 ENCOUNTER — Other Ambulatory Visit: Payer: Self-pay

## 2017-08-20 NOTE — Telephone Encounter (Signed)
Left message for patient to call and get Panorama results. Tonya StammerJennifer Aeralyn Barna RN

## 2017-08-26 ENCOUNTER — Ambulatory Visit (INDEPENDENT_AMBULATORY_CARE_PROVIDER_SITE_OTHER): Payer: 59 | Admitting: Family Medicine

## 2017-08-26 VITALS — BP 113/57 | HR 74 | Wt 172.1 lb

## 2017-08-26 DIAGNOSIS — Z3400 Encounter for supervision of normal first pregnancy, unspecified trimester: Secondary | ICD-10-CM

## 2017-08-26 NOTE — Progress Notes (Signed)
   PRENATAL VISIT NOTE  Subjective:  Tonya Douglas is a 32 y.o. G1P0000 at 3651w0d being seen today for ongoing prenatal care.  She is currently monitored for the following issues for this low-risk pregnancy and has Supervision of normal first pregnancy, antepartum on their problem list.  Patient reports no complaints.  Contractions: Not present. Vag. Bleeding: None.  Movement: Absent. Denies leaking of fluid.   The following portions of the patient's history were reviewed and updated as appropriate: allergies, current medications, past family history, past medical history, past social history, past surgical history and problem list. Problem list updated.  Objective:   Vitals:   08/26/17 0830  BP: (!) 113/57  Pulse: 74  Weight: 172 lb 1.9 oz (78.1 kg)    Fetal Status: Fetal Heart Rate (bpm): 141   Movement: Absent     General:  Alert, oriented and cooperative. Patient is in no acute distress.  Skin: Skin is warm and dry. No rash noted.   Cardiovascular: Normal heart rate noted  Respiratory: Normal respiratory effort, no problems with respiration noted  Abdomen: Soft, gravid, appropriate for gestational age.  Pain/Pressure: Absent     Pelvic: Cervical exam deferred        Extremities: Normal range of motion.  Edema: None  Mental Status: Normal mood and affect. Normal behavior. Normal judgment and thought content.   Assessment and Plan:  Pregnancy: G1P0000 at 6651w0d  1. Supervision of normal first pregnancy, antepartum FHT and FH normal - US MFM OB COMP + 14 WK; Future  Preterm labor symptoms and general obstetric precautions including but not limited to vaginal bleeding, contractions, leaking of fluid and fetal movement were reviewed in detail with the patient. Please refer to After Visit Summary for other counseling recommendations.  Return in about 1 month (around 09/23/2017).  No future appointments.  Levie HeritageJacob J Stinson, DO

## 2017-08-28 ENCOUNTER — Encounter: Payer: 59 | Admitting: Obstetrics & Gynecology

## 2017-09-05 ENCOUNTER — Telehealth: Payer: Self-pay

## 2017-09-05 NOTE — Telephone Encounter (Signed)
Patient states that yesterday she was walking and sneezed really hard. She said she tensed up a lot. Patient states that she is [redacted] weeks pregnant and she just tried some ibuprofen for the discomfort.  Patient states it hurts in her tailbone area. Denies any bleeding or leaking of fluid. Patient did not fall on any body part.  Suggested that she not use ibuprofen during pregnancy and use tylenol. Patient can rotate ice and heating pad (10 mins on and 10 mins off). Also moved up OB appt a week earlier with Dr. Adrian BlackwaterStinson so he can evaluate. Patient also told if her pain not relieved or getting any better she can go to [facility] for evaluation. Patient states understanding. Armandina StammerJennifer Jaretzy Lhommedieu RN

## 2017-09-19 ENCOUNTER — Ambulatory Visit (INDEPENDENT_AMBULATORY_CARE_PROVIDER_SITE_OTHER): Payer: 59 | Admitting: Family Medicine

## 2017-09-19 ENCOUNTER — Encounter (HOSPITAL_COMMUNITY): Payer: Self-pay

## 2017-09-19 VITALS — BP 117/81 | HR 87 | Wt 176.0 lb

## 2017-09-19 DIAGNOSIS — Z3400 Encounter for supervision of normal first pregnancy, unspecified trimester: Secondary | ICD-10-CM

## 2017-09-19 LAB — POCT URINALYSIS DIPSTICK OB
GLUCOSE, UA: NEGATIVE — AB
POC,PROTEIN,UA: NEGATIVE

## 2017-09-19 NOTE — Progress Notes (Signed)
Patient scheduled for anatomy ultrasound next week on 09/26/17.Armandina StammerJennifer Liesa Tsan RN

## 2017-09-19 NOTE — Progress Notes (Signed)
   PRENATAL VISIT NOTE  Subjective:  Tonya Douglas is a 32 y.o. G1P0000 at 479w3d being seen today for ongoing prenatal care.  She is currently monitored for the following issues for this low-risk pregnancy and has Supervision of normal first pregnancy, antepartum on their problem list.  Patient reports mild tail bone pain. Improving.  Contractions: Not present. Vag. Bleeding: None.  Movement: Present. Denies leaking of fluid.   The following portions of the patient's history were reviewed and updated as appropriate: allergies, current medications, past family history, past medical history, past social history, past surgical history and problem list. Problem list updated.  Objective:   Vitals:   09/19/17 0834  BP: 117/81  Pulse: 87  Weight: 176 lb (79.8 kg)    Fetal Status: Fetal Heart Rate (bpm): 140 Fundal Height: 20 cm Movement: Present     General:  Alert, oriented and cooperative. Patient is in no acute distress.  Skin: Skin is warm and dry. No rash noted.   Cardiovascular: Normal heart rate noted  Respiratory: Normal respiratory effort, no problems with respiration noted  Abdomen: Soft, gravid, appropriate for gestational age.  Pain/Pressure: Absent     Pelvic: Cervical exam deferred        Extremities: Normal range of motion.  Edema: None  Mental Status: Normal mood and affect. Normal behavior. Normal judgment and thought content.   Assessment and Plan:  Pregnancy: G1P0000 at 8579w3d  1. Supervision of normal first pregnancy, antepartum FHT and FH normal  Preterm labor symptoms and general obstetric precautions including but not limited to vaginal bleeding, contractions, leaking of fluid and fetal movement were reviewed in detail with the patient. Please refer to After Visit Summary for other counseling recommendations.  Return in about 4 weeks (around 10/17/2017) for OB f/u.  Future Appointments  Date Time Provider Department Center  09/26/2017 10:00 AM [facility] US 3  [facility]    Levie HeritageJacob J Stinson, DO

## 2017-09-19 NOTE — Addendum Note (Signed)
Addended by: Anell BarrHOWARD, Aquil Duhe L on: 09/19/2017 10:57 AM   Modules accepted: Orders

## 2017-09-26 ENCOUNTER — Encounter: Payer: 59 | Admitting: Obstetrics & Gynecology

## 2017-09-26 ENCOUNTER — Other Ambulatory Visit (HOSPITAL_COMMUNITY): Payer: Self-pay | Admitting: *Deleted

## 2017-09-26 ENCOUNTER — Ambulatory Visit (HOSPITAL_COMMUNITY)
Admission: RE | Admit: 2017-09-26 | Discharge: 2017-09-26 | Disposition: A | Discharge status: Home / Self Care | Payer: 59 | Source: Ambulatory Visit | Attending: Family Medicine | Admitting: Family Medicine

## 2017-09-26 DIAGNOSIS — Z3400 Encounter for supervision of normal first pregnancy, unspecified trimester: Secondary | ICD-10-CM | POA: Diagnosis present

## 2017-09-26 DIAGNOSIS — Z3689 Encounter for other specified antenatal screening: Secondary | ICD-10-CM | POA: Diagnosis present

## 2017-09-26 DIAGNOSIS — Z3402 Encounter for supervision of normal first pregnancy, second trimester: Secondary | ICD-10-CM | POA: Diagnosis not present

## 2017-09-26 DIAGNOSIS — Z363 Encounter for antenatal screening for malformations: Secondary | ICD-10-CM

## 2017-09-26 DIAGNOSIS — Z3A20 20 weeks gestation of pregnancy: Secondary | ICD-10-CM | POA: Insufficient documentation

## 2017-09-26 DIAGNOSIS — Z362 Encounter for other antenatal screening follow-up: Secondary | ICD-10-CM

## 2017-10-24 ENCOUNTER — Ambulatory Visit (INDEPENDENT_AMBULATORY_CARE_PROVIDER_SITE_OTHER): Payer: 59 | Admitting: Family Medicine

## 2017-10-24 VITALS — BP 102/54 | HR 84 | Wt 181.0 lb

## 2017-10-24 DIAGNOSIS — Z3400 Encounter for supervision of normal first pregnancy, unspecified trimester: Secondary | ICD-10-CM

## 2017-10-24 NOTE — Progress Notes (Signed)
   PRENATAL VISIT NOTE  Subjective:  Tonya Douglas is a 32 y.o. G1P0000 at 3822w3d being seen today for ongoing prenatal care.  She is currently monitored for the following issues for this low-risk pregnancy and has Supervision of normal first pregnancy, antepartum on their problem list.  Patient reports no complaints.  Contractions: Not present. Vag. Bleeding: None.  Movement: Present. Denies leaking of fluid.   The following portions of the patient's history were reviewed and updated as appropriate: allergies, current medications, past family history, past medical history, past social history, past surgical history and problem list. Problem list updated.  Objective:   Vitals:   10/24/17 0832  BP: (!) 102/54  Pulse: 84  Weight: 181 lb (82.1 kg)    Fetal Status: Fetal Heart Rate (bpm): 135   Movement: Present     General:  Alert, oriented and cooperative. Patient is in no acute distress.  Skin: Skin is warm and dry. No rash noted.   Cardiovascular: Normal heart rate noted  Respiratory: Normal respiratory effort, no problems with respiration noted  Abdomen: Soft, gravid, appropriate for gestational age.  Pain/Pressure: Present     Pelvic: Cervical exam deferred        Extremities: Normal range of motion.  Edema: None  Mental Status: Normal mood and affect. Normal behavior. Normal judgment and thought content.   Assessment and Plan:  Pregnancy: G1P0000 at 9322w3d  1. Supervision of normal first pregnancy, antepartum FHT and FH normal  Preterm labor symptoms and general obstetric precautions including but not limited to vaginal bleeding, contractions, leaking of fluid and fetal movement were reviewed in detail with the patient. Please refer to After Visit Summary for other counseling recommendations.  Return in about 4 weeks (around 11/21/2017) for OB f/u, 2 hr GTT.  Future Appointments  Date Time Provider Department Center  10/30/2017  8:45 AM [facility] US 2 [facility]    Levie HeritageJacob  J Stinson, DO

## 2017-10-30 ENCOUNTER — Ambulatory Visit (HOSPITAL_COMMUNITY)
Admission: RE | Admit: 2017-10-30 | Discharge: 2017-10-30 | Disposition: A | Discharge status: Home / Self Care | Payer: 59 | Source: Ambulatory Visit | Attending: Family Medicine | Admitting: Family Medicine

## 2017-10-30 DIAGNOSIS — Z362 Encounter for other antenatal screening follow-up: Secondary | ICD-10-CM | POA: Insufficient documentation

## 2017-10-30 DIAGNOSIS — Z3A25 25 weeks gestation of pregnancy: Secondary | ICD-10-CM | POA: Diagnosis not present

## 2017-11-21 ENCOUNTER — Ambulatory Visit (INDEPENDENT_AMBULATORY_CARE_PROVIDER_SITE_OTHER): Payer: 59 | Admitting: Obstetrics & Gynecology

## 2017-11-21 VITALS — BP 124/66 | HR 98 | Wt 185.0 lb

## 2017-11-21 DIAGNOSIS — Z3400 Encounter for supervision of normal first pregnancy, unspecified trimester: Secondary | ICD-10-CM

## 2017-11-21 DIAGNOSIS — Z23 Encounter for immunization: Secondary | ICD-10-CM

## 2017-11-21 DIAGNOSIS — Z3403 Encounter for supervision of normal first pregnancy, third trimester: Secondary | ICD-10-CM

## 2017-11-21 NOTE — Progress Notes (Signed)
Tdap given Pt doing 2 hour GTT

## 2017-11-21 NOTE — Patient Instructions (Signed)

## 2017-11-21 NOTE — Progress Notes (Signed)
   PRENATAL VISIT NOTE  Subjective:  Tonya Douglas is a 32 y.o. G1P0000 at [redacted]w[redacted]d being seen today for ongoing prenatal care.  She is currently monitored for the following issues for this low-risk pregnancy and has Supervision of normal first pregnancy, antepartum on their problem list.  Patient reports no complaints.  Contractions: Not present. Vag. Bleeding: None.  Movement: Present. Denies leaking of fluid.   The following portions of the patient's history were reviewed and updated as appropriate: allergies, current medications, past family history, past medical history, past social history, past surgical history and problem list. Problem list updated.  Objective:   Vitals:   11/21/17 0857  BP: 124/66  Pulse: 98  Weight: 185 lb (83.9 kg)    Fetal Status: Fetal Heart Rate (bpm): 140   Movement: Present     General:  Alert, oriented and cooperative. Patient is in no acute distress.  Skin: Skin is warm and dry. No rash noted.   Cardiovascular: Normal heart rate noted  Respiratory: Normal respiratory effort, no problems with respiration noted  Abdomen: Soft, gravid, appropriate for gestational age.  Pain/Pressure: Present     Pelvic: Cervical exam deferred        Extremities: Normal range of motion.  Edema: None  Mental Status: Normal mood and affect. Normal behavior. Normal judgment and thought content.   Assessment and Plan:  Pregnancy: G1P0000 at [redacted]w[redacted]d  1. Supervision of normal first pregnancy, antepartum - Tdap vaccine greater than or equal to 7yo IM - Glucose Tolerance, 2 Hours w/1 Hour - HIV antibody (with reflex) - CBC - RPR  Preterm labor symptoms and general obstetric precautions including but not limited to vaginal bleeding, contractions, leaking of fluid and fetal movement were reviewed in detail with the patient. Please refer to After Visit Summary for other counseling recommendations.  Return in about 2 weeks (around 12/05/2017).  No future  appointments.  Willodean Rosenthal, MD

## 2017-11-22 LAB — CBC
HEMOGLOBIN: 12.5 g/dL (ref 11.1–15.9)
Hematocrit: 37.1 % (ref 34.0–46.6)
MCH: 30.6 pg (ref 26.6–33.0)
MCHC: 33.7 g/dL (ref 31.5–35.7)
MCV: 91 fL (ref 79–97)
Platelets: 263 10*3/uL (ref 150–450)
RBC: 4.09 x10E6/uL (ref 3.77–5.28)
RDW: 13.5 % (ref 12.3–15.4)
WBC: 9.7 10*3/uL (ref 3.4–10.8)

## 2017-11-22 LAB — HIV ANTIBODY (ROUTINE TESTING W REFLEX): HIV Screen 4th Generation wRfx: NONREACTIVE

## 2017-11-22 LAB — GLUCOSE TOLERANCE, 2 HOURS W/ 1HR
GLUCOSE, 2 HOUR: 172 mg/dL — AB (ref 65–152)
Glucose, 1 hour: 170 mg/dL (ref 65–179)
Glucose, Fasting: 61 mg/dL — ABNORMAL LOW (ref 65–91)

## 2017-11-22 LAB — RPR: RPR: NONREACTIVE

## 2017-11-24 ENCOUNTER — Encounter: Payer: Self-pay | Admitting: Obstetrics & Gynecology

## 2017-11-24 DIAGNOSIS — O24419 Gestational diabetes mellitus in pregnancy, unspecified control: Secondary | ICD-10-CM | POA: Insufficient documentation

## 2017-11-25 ENCOUNTER — Telehealth: Payer: Self-pay

## 2017-11-25 DIAGNOSIS — O24419 Gestational diabetes mellitus in pregnancy, unspecified control: Secondary | ICD-10-CM

## 2017-11-25 NOTE — Telephone Encounter (Signed)
Called pt with 2 hr GTT results. Pt is scheduled for Diabetes Education on 12/04/17 at 3pm. Understanding was voiced.

## 2017-11-25 NOTE — Telephone Encounter (Signed)
-----   Message from Willodean Rosenthal, MD sent at 11/24/2017  1:16 PM EDT ----- Please call pt. Her  GTT was abnormal. Please notify her and she needs a referral to diabetes educator for glc monition .   Thx, clh-S

## 2017-12-03 ENCOUNTER — Ambulatory Visit (INDEPENDENT_AMBULATORY_CARE_PROVIDER_SITE_OTHER): Payer: 59 | Admitting: Advanced Practice Midwife

## 2017-12-03 ENCOUNTER — Encounter: Payer: Self-pay | Admitting: Advanced Practice Midwife

## 2017-12-03 VITALS — BP 119/79 | HR 89 | Wt 188.0 lb

## 2017-12-03 DIAGNOSIS — Z3400 Encounter for supervision of normal first pregnancy, unspecified trimester: Secondary | ICD-10-CM

## 2017-12-03 DIAGNOSIS — O24419 Gestational diabetes mellitus in pregnancy, unspecified control: Secondary | ICD-10-CM

## 2017-12-03 NOTE — Progress Notes (Signed)
   PRENATAL VISIT NOTE  Subjective:  Tempestt Silba is a 32 y.o. G1P0000 at [redacted]w[redacted]d being seen today for ongoing prenatal care.  She is currently monitored for the following issues for this high-risk pregnancy and has Supervision of normal first pregnancy, antepartum and Gestational diabetes mellitus (GDM) affecting pregnancy on their problem list.  Patient reports no complaints.  Contractions: Not present. Vag. Bleeding: None.  Movement: Present. Denies leaking of fluid.   The following portions of the patient's history were reviewed and updated as appropriate: allergies, current medications, past family history, past medical history, past social history, past surgical history and problem list. Problem list updated.  Objective:   Vitals:   12/03/17 0834  BP: 119/79  Pulse: 89  Weight: 85.3 kg    Fetal Status: Fetal Heart Rate (bpm): 131 Fundal Height: 30 cm Movement: Present     General:  Alert, oriented and cooperative. Patient is in no acute distress.  Skin: Skin is warm and dry. No rash noted.   Cardiovascular: Normal heart rate noted  Respiratory: Normal respiratory effort, no problems with respiration noted  Abdomen: Soft, gravid, appropriate for gestational age.  Pain/Pressure: Absent     Pelvic: Cervical exam deferred        Extremities: Normal range of motion.  Edema: None  Mental Status: Normal mood and affect. Normal behavior. Normal judgment and thought content.   Assessment and Plan:  Pregnancy: G1P0000 at [redacted]w[redacted]d  1. Supervision of normal first pregnancy, antepartum      Wants to try waterbirth      Brought class certificate      Linden Dolin issues reviewed.  Discussed that for now we are permitting well controlled diabetics with no meds for WB.  Discussed that risks may change and that this is a fluid issue, in that if things come up that concern [place], she might get ruled out.       Discussed buying tub and having family member do setup/takedown or using service or doula.    (after visit, I realized we did not sign consent, as we got to talking about diabetes)  2.    Gestational Diabetes      Has appt tomorrow for teaching      Discussed diet and importance of maintaining good BS control      Discussed BS goals  Preterm labor symptoms and general obstetric precautions including but not limited to vaginal bleeding, contractions, leaking of fluid and fetal movement were reviewed in detail with the patient. Please refer to After Visit Summary for other counseling recommendations.  Return in about 2 weeks (around 12/17/2017) for [employer].  Future Appointments  Date Time Provider Department Center  12/04/2017  3:15 PM [facility] GDM CLASS [facility] NDM  12/16/2017  8:45 AM Adrian Blackwater Rhona Raider, DO [facility] None    Wynelle Bourgeois, CNM

## 2017-12-03 NOTE — Patient Instructions (Addendum)
AREA PEDIATRIC/FAMILY [place] [address] [place], [place]  [address] Phone - [phone]   Fax - [phone]  [facility] [place] [address] [place], [place] [address] Phone - [phone]   Fax - [place] [address] [place], [place]  [address] Phone - [phone]   Fax - [phone]  [place] [place] [address] [place], [place]  [address] Phone - [phone]   Fax - [phone]  [place] [address] [place], [place]  [address] Phone - [phone]   Fax - [phone]  [facility] [address] [place], [place]  [address] Phone - [phone]   Fax - [place] [address] [place], [place]  [address] Phone - [phone]   Fax - [phone]  [place] [address] [place], [place] [address] [place], [place]  [address] Phone - [phone]   Fax - [place] [address] [place], [place]  [address] Phone - [phone]   Fax - [phone] [facility] [place] [place], [place]  [address] Phone - [phone]   Fax - [phone]  [place] [address] [place], [place]  [address] Phone - [phone]   Fax - [phone]  [facility] AT [place], [place], [place]  [address] Phone - [phone]   Fax - [place] [address] [place], [place]  [address] Phone - [phone]   Fax - [phone]  [facility] [address] [place], [place]  [address] Phone - [place], [place]  [address] Phone - [phone]   Fax - [place] [address] [place], [place]  [address] Phone - [phone]   Fax - [phone]  [place] [address] [place], [place]  [address] Phone - [phone]   Fax - [place]. [place], [place]  [address] Phone - [phone]   Fax - [place] [address], [place], [place]  [address] Phone - [phone]   Fax - [place] [address] [place], [place]  [address] Phone - [phone]   Fax - [phone]  [facility] RUB[place] [address], [place], [place]  [address] Phone - [phone]   Fax - [place] [address], [place], [place]  [address] Phone - [phone]   Fax - [phone]  [place] [address] [place], [place]  [address] Phone - [phone]   Fax - [phone] [place] [address] [place], [place]  [address] Phone - [phone]   Fax - [place] [address] [place], [place]  [address] Phone - [phone]   Fax - [place] [address], [place],   [address] Phone - [phone]   Fax - [phone]  Terra Bella MD [address] [place] [address] Phone [phone]  Fax [phone] Thinking About Doren Custard???  You must attend a Doren Custard class at [facility]  3rd Wednesday  of every month from 7-9pm  Free  [employer] by calling [phone] or online at VFederal.at  Bring [place] the certificate from the class  Doren Custard supplies needed for [facility] Department patients:  Our practice has a Heritage manager in a Box tub at the hospital that you can borrow  You will need to purchase an accessory kit that has all needed supplies through [facility] [phone]) or online $175.00  Or you can purchase the supplies separately: o Single-use disposable tub liner for Birth Pool in a Box (REGULAR size) o New garden hose labeled "lead-free", "suitable for drinking water", o Electric drain pump to remove water (We recommend 792 gallon per hour or greater pump.)  o  "non-toxic" OR "water potable" o Garden hose to remove the dirty water o Fish net o Bathing suit top (optional) o Long-handled mirror (optional)  [employer] sells tubs for ~ $120 if you would rather purchase your own tub.  They also sell accessories, liners.    Www.waterbirthsolutions.com for tub purchases and supplies  The Labor Ladies (www.thelaborladies.com) $275 for tub rental/set-up & take down/kit   [employer] information regarding doulas (labor support) who provide pool rentals:  IdentityList.se.htm   The Labor Ladies (www.thelaborladies.com)  IdentityList.se.htm   Things that would prevent you from having a waterbirth:  Premature, <37wks  Previous cesarean birth  Presence of thick meconium-stained fluid  Multiple gestation (Twins, triplets, etc.)  Uncontrolled diabetes or gestational diabetes requiring medication  Hypertension  Heavy vaginal bleeding  Non-reassuring fetal heart rate  Active infection (MRSA, etc.)  If your labor has to be induced and induction method requires continuous monitoring of the baby's heart rate  Other risks/issues identified by your obstetrical provider

## 2017-12-04 ENCOUNTER — Encounter: Payer: Self-pay | Admitting: Registered"

## 2017-12-04 ENCOUNTER — Encounter: Payer: 59 | Attending: Obstetrics & Gynecology | Admitting: Registered"

## 2017-12-04 DIAGNOSIS — O99810 Abnormal glucose complicating pregnancy: Secondary | ICD-10-CM | POA: Insufficient documentation

## 2017-12-04 DIAGNOSIS — Z3A00 Weeks of gestation of pregnancy not specified: Secondary | ICD-10-CM | POA: Insufficient documentation

## 2017-12-04 DIAGNOSIS — O24419 Gestational diabetes mellitus in pregnancy, unspecified control: Secondary | ICD-10-CM | POA: Insufficient documentation

## 2017-12-04 NOTE — Progress Notes (Signed)
Patient was seen on 12/04/2017 for Gestational Diabetes self-management class at [facility]. The following learning objectives were met by the patient during this course:   States the definition of Gestational Diabetes  States why dietary management is important in controlling blood glucose  Describes the effects each nutrient has on blood glucose levels  Demonstrates ability to create a balanced meal plan  Demonstrates carbohydrate counting   States when to check blood glucose levels  Demonstrates proper blood glucose monitoring techniques  States the effect of stress and exercise on blood glucose levels  States the importance of limiting caffeine and abstaining from alcohol and smoking  Blood glucose monitor given: [employer] Lot # [id number] X Exp: 09/05/18 Blood glucose reading: 72  Patient instructed to monitor glucose levels: FBS: 60 - <95; 1 hour: <140; 2 hour: <120  Patient received handouts:  Nutrition Diabetes and Pregnancy, including carb counting list  Patient will be seen for follow-up as needed.

## 2017-12-05 ENCOUNTER — Encounter: Payer: 59 | Admitting: Family Medicine

## 2017-12-05 ENCOUNTER — Ambulatory Visit: Payer: 59 | Admitting: Obstetrics and Gynecology

## 2017-12-16 ENCOUNTER — Ambulatory Visit (INDEPENDENT_AMBULATORY_CARE_PROVIDER_SITE_OTHER): Payer: 59 | Admitting: Family Medicine

## 2017-12-16 VITALS — BP 108/64 | HR 74 | Wt 190.1 lb

## 2017-12-16 DIAGNOSIS — B009 Herpesviral infection, unspecified: Secondary | ICD-10-CM

## 2017-12-16 DIAGNOSIS — O24419 Gestational diabetes mellitus in pregnancy, unspecified control: Secondary | ICD-10-CM

## 2017-12-16 DIAGNOSIS — Z3400 Encounter for supervision of normal first pregnancy, unspecified trimester: Secondary | ICD-10-CM

## 2017-12-16 DIAGNOSIS — O98513 Other viral diseases complicating pregnancy, third trimester: Secondary | ICD-10-CM

## 2017-12-16 DIAGNOSIS — O98519 Other viral diseases complicating pregnancy, unspecified trimester: Secondary | ICD-10-CM

## 2017-12-16 LAB — POCT URINALYSIS DIPSTICK OB
Glucose, UA: NEGATIVE
KETONES UA: NEGATIVE
Leukocytes, UA: NEGATIVE
Nitrite, UA: NEGATIVE

## 2017-12-16 MED ORDER — VALACYCLOVIR HCL 500 MG PO TABS: 500.0000 mg | ORAL_TABLET | Freq: Two times a day (BID) | ORAL | 4 refills | Status: DC

## 2017-12-16 MED ORDER — GLUCOSE BLOOD VI STRP: ORAL_STRIP | 12 refills | Status: DC

## 2017-12-16 MED ORDER — ACCU-CHEK FASTCLIX LANCETS MISC: 1.0000 [IU] | Freq: Four times a day (QID) | 12 refills | Status: DC

## 2017-12-16 NOTE — Progress Notes (Signed)
   PRENATAL VISIT NOTE  Subjective:  Tonya Douglas is a 32 y.o. G1P0000 at [redacted]w[redacted]d being seen today for ongoing prenatal care.  She is currently monitored for the following issues for this high-risk pregnancy and has Supervision of normal first pregnancy, antepartum; Gestational diabetes mellitus (GDM) affecting pregnancy; Abnormal glucose tolerance test (GTT) during pregnancy, antepartum; and HSV-2 infection complicating pregnancy on their problem list.  Patient reports no complaints.  Contractions: Not present. Vag. Bleeding: None.  Movement: Present. Denies leaking of fluid.   The following portions of the patient's history were reviewed and updated as appropriate: allergies, current medications, past family history, past medical history, past social history, past surgical history and problem list. Problem list updated.  Objective:   Vitals:   12/16/17 0843  BP: 108/64  Pulse: 74  Weight: 190 lb 1.3 oz (86.2 kg)    Fetal Status: Fetal Heart Rate (bpm): 135 Fundal Height: 33 cm Movement: Present     General:  Alert, oriented and cooperative. Patient is in no acute distress.  Skin: Skin is warm and dry. No rash noted.   Cardiovascular: Normal heart rate noted  Respiratory: Normal respiratory effort, no problems with respiration noted  Abdomen: Soft, gravid, appropriate for gestational age.  Pain/Pressure: Absent     Pelvic: Cervical exam deferred        Extremities: Normal range of motion.  Edema: None  Mental Status: Normal mood and affect. Normal behavior. Normal judgment and thought content.   Assessment and Plan:  Pregnancy: G1P0000 at [redacted]w[redacted]d  1. Supervision of normal first pregnancy, antepartum FHT and FH normal  2. Gestational diabetes mellitus (GDM), antepartum, gestational diabetes method of control unspecified Didn't have test supplies. - POC Urinalysis Dipstick OB  3. Herpes simplex virus type 2 (HSV-2) infection affecting pregnancy in third trimester Increase valtrex  to 500mg  BID.  Preterm labor symptoms and general obstetric precautions including but not limited to vaginal bleeding, contractions, leaking of fluid and fetal movement were reviewed in detail with the patient. Please refer to After Visit Summary for other counseling recommendations.  No follow-ups on file.  Future Appointments  Date Time Provider Department Center  12/31/2017  8:30 AM Aviva Signs, CNM [facility] None    Levie Heritage, DO

## 2017-12-24 ENCOUNTER — Other Ambulatory Visit: Payer: Self-pay

## 2017-12-24 DIAGNOSIS — O24419 Gestational diabetes mellitus in pregnancy, unspecified control: Secondary | ICD-10-CM

## 2017-12-24 MED ORDER — ACCU-CHEK AVIVA PLUS W/DEVICE KIT: 1.0000 | PACK | Freq: Four times a day (QID) | 0 refills | Status: DC

## 2017-12-30 ENCOUNTER — Other Ambulatory Visit: Payer: Self-pay

## 2017-12-30 DIAGNOSIS — O24419 Gestational diabetes mellitus in pregnancy, unspecified control: Secondary | ICD-10-CM

## 2017-12-30 MED ORDER — GLUCOSE BLOOD VI STRP: ORAL_STRIP | 12 refills | Status: DC

## 2017-12-31 ENCOUNTER — Encounter: Payer: Self-pay | Admitting: Advanced Practice Midwife

## 2017-12-31 ENCOUNTER — Ambulatory Visit (INDEPENDENT_AMBULATORY_CARE_PROVIDER_SITE_OTHER): Payer: 59 | Admitting: Advanced Practice Midwife

## 2017-12-31 VITALS — BP 116/65 | HR 89 | Wt 190.0 lb

## 2017-12-31 DIAGNOSIS — O24419 Gestational diabetes mellitus in pregnancy, unspecified control: Secondary | ICD-10-CM

## 2017-12-31 DIAGNOSIS — Z3400 Encounter for supervision of normal first pregnancy, unspecified trimester: Secondary | ICD-10-CM

## 2017-12-31 DIAGNOSIS — Z3A34 34 weeks gestation of pregnancy: Secondary | ICD-10-CM

## 2017-12-31 NOTE — Progress Notes (Signed)
   PRENATAL VISIT NOTE  Subjective:  Tonya Douglas is a 32 y.o. G1P0000 at 3319w1d being seen today for ongoing prenatal care.  She is currently monitored for the following issues for this high-risk pregnancy and has Supervision of normal first pregnancy, antepartum; Gestational diabetes mellitus (GDM) affecting pregnancy; Abnormal glucose tolerance test (GTT) during pregnancy, antepartum; and HSV-2 infection complicating pregnancy on their problem list.  Patient reports no complaints.  Contractions: Not present. Vag. Bleeding: None.  Movement: Present. Denies leaking of fluid.   Did Diabetes class on 10/30 Had trouble with getting wrong strips, bought another meter, then had wrong strips again Finally got correct ones and started monitoring 12/31/17  >> FBS = 70 Instructed to monitor this week and return in 1 week  The following portions of the patient's history were reviewed and updated as appropriate: allergies, current medications, past family history, past medical history, past social history, past surgical history and problem list. Problem list updated.  Objective:   Vitals:   12/31/17 0834  BP: 116/65  Pulse: 89  Weight: 86.2 kg    Fetal Status: Fetal Heart Rate (bpm): 130   Movement: Present     General:  Alert, oriented and cooperative. Patient is in no acute distress.  Skin: Skin is warm and dry. No rash noted.   Cardiovascular: Normal heart rate noted  Respiratory: Normal respiratory effort, no problems with respiration noted  Abdomen: Soft, gravid, appropriate for gestational age.  Pain/Pressure: Absent     Pelvic: Cervical exam deferred        Extremities: Normal range of motion.  Edema: None  Mental Status: Normal mood and affect. Normal behavior. Normal judgment and thought content.   Assessment and Plan:  Pregnancy: G1P0000 at 5919w1d  1. Gestational diabetes mellitus (GDM), antepartum, gestational diabetes method of control unspecified      Did first check today.   Fasting BS was 70       See above for problems she had w/getting meter and correct strips       Consulted Dr Adrian BlackwaterStinson.  No need for HgbA1C, will have her monitor levels and bring written list in a week for review.  Pt agrees to plan   States she has been following diet.  2. Supervision of normal first pregnancy, antepartum     Fundal Height appropriate   %%%ile at 25 weeks     Decided against doing waterbirth.  Went to class but it is too much for her to deal with now.   3. Gestational diabetes mellitus (GDM) affecting pregnancy     See above  Preterm labor symptoms and general obstetric precautions including but not limited to vaginal bleeding, contractions, leaking of fluid and fetal movement were reviewed in detail with the patient. Please refer to After Visit Summary for other counseling recommendations.  Return in about 1 week (around 01/07/2018) [facility]  Future Appointments  Date Time Provider Department Center  01/09/2018  8:15 AM Levie HeritageStinson, Jacob J, DO [facility] None    Wynelle BourgeoisMarie Claudene Gatliff, CNM

## 2017-12-31 NOTE — Patient Instructions (Signed)

## 2018-01-09 ENCOUNTER — Ambulatory Visit (INDEPENDENT_AMBULATORY_CARE_PROVIDER_SITE_OTHER): Payer: 59 | Admitting: Family Medicine

## 2018-01-09 VITALS — BP 122/66 | HR 77 | Wt 189.0 lb

## 2018-01-09 DIAGNOSIS — B009 Herpesviral infection, unspecified: Secondary | ICD-10-CM

## 2018-01-09 DIAGNOSIS — Z3A35 35 weeks gestation of pregnancy: Secondary | ICD-10-CM

## 2018-01-09 DIAGNOSIS — Z3400 Encounter for supervision of normal first pregnancy, unspecified trimester: Secondary | ICD-10-CM

## 2018-01-09 DIAGNOSIS — Z3403 Encounter for supervision of normal first pregnancy, third trimester: Secondary | ICD-10-CM

## 2018-01-09 DIAGNOSIS — O98513 Other viral diseases complicating pregnancy, third trimester: Secondary | ICD-10-CM

## 2018-01-09 DIAGNOSIS — O24419 Gestational diabetes mellitus in pregnancy, unspecified control: Secondary | ICD-10-CM

## 2018-01-09 NOTE — Progress Notes (Signed)
   PRENATAL VISIT NOTE  Subjective:  Tonya Douglas is a 32 y.o. G1P0000 at 3161w3d being seen today for ongoing prenatal care.  She is currently monitored for the following issues for this high-risk pregnancy and has Supervision of normal first pregnancy, antepartum; Gestational diabetes mellitus (GDM) affecting pregnancy; Abnormal glucose tolerance test (GTT) during pregnancy, antepartum; and HSV-2 infection complicating pregnancy on their problem list.  Patient reports no complaints.  Contractions: Irritability. Vag. Bleeding: None.  Movement: Present. Denies leaking of fluid.   The following portions of the patient's history were reviewed and updated as appropriate: allergies, current medications, past family history, past medical history, past social history, past surgical history and problem list. Problem list updated.  Objective:   Vitals:   01/09/18 0813  BP: 122/66  Pulse: 77  Weight: 189 lb (85.7 kg)    Fetal Status: Fetal Heart Rate (bpm): 140   Movement: Present     General:  Alert, oriented and cooperative. Patient is in no acute distress.  Skin: Skin is warm and dry. No rash noted.   Cardiovascular: Normal heart rate noted  Respiratory: Normal respiratory effort, no problems with respiration noted  Abdomen: Soft, gravid, appropriate for gestational age.  Pain/Pressure: Present     Pelvic: Cervical exam deferred        Extremities: Normal range of motion.  Edema: None  Mental Status: Normal mood and affect. Normal behavior. Normal judgment and thought content.   Assessment and Plan:  Pregnancy: G1P0000 at 5961w3d  1. Supervision of normal first pregnancy, antepartum FHT and FH normal  2. Gestational diabetes mellitus (GDM), antepartum, gestational diabetes method of control unspecified Diet controlled 3 elevated fasting 6 elevated postprandial. Some of the high CBGs after eating fast food. Discussed diet. Recheck next week  3. Herpes simplex virus type 2 (HSV-2)  infection affecting pregnancy in third trimester On valtrex  Preterm labor symptoms and general obstetric precautions including but not limited to vaginal bleeding, contractions, leaking of fluid and fetal movement were reviewed in detail with the patient. Please refer to After Visit Summary for other counseling recommendations.  Return in about 1 week (around 01/16/2018) for OB f/u.  No future appointments.  Levie HeritageJacob J Stinson, DO

## 2018-01-16 ENCOUNTER — Encounter: Payer: Self-pay | Admitting: Family Medicine

## 2018-01-16 ENCOUNTER — Ambulatory Visit (INDEPENDENT_AMBULATORY_CARE_PROVIDER_SITE_OTHER): Payer: 59 | Admitting: Family Medicine

## 2018-01-16 VITALS — BP 133/63 | HR 89 | Wt 188.0 lb

## 2018-01-16 DIAGNOSIS — Z3403 Encounter for supervision of normal first pregnancy, third trimester: Secondary | ICD-10-CM

## 2018-01-16 DIAGNOSIS — Z113 Encounter for screening for infections with a predominantly sexual mode of transmission: Secondary | ICD-10-CM | POA: Diagnosis not present

## 2018-01-16 DIAGNOSIS — O24419 Gestational diabetes mellitus in pregnancy, unspecified control: Secondary | ICD-10-CM

## 2018-01-16 DIAGNOSIS — Z3400 Encounter for supervision of normal first pregnancy, unspecified trimester: Secondary | ICD-10-CM

## 2018-01-16 LAB — OB RESULTS CONSOLE GC/CHLAMYDIA: GC PROBE AMP, GENITAL: NEGATIVE

## 2018-01-16 LAB — OB RESULTS CONSOLE GBS: GBS: POSITIVE

## 2018-01-16 NOTE — Progress Notes (Signed)
Subjective:  Tonya DammeBreena Douglas is a 32 y.o. G1P0000 at 6973w3d being seen today for ongoing prenatal care.  She is currently monitored for the following issues for this high-risk pregnancy and has Supervision of normal first pregnancy, antepartum; Gestational diabetes mellitus (GDM) affecting pregnancy; Abnormal glucose tolerance test (GTT) during pregnancy, antepartum; and HSV-2 infection complicating pregnancy on their problem list.  GDM: Patient diet controlled.  Fasting: 2 elevated 2hr PP: 5 elevated - improved the last two days with better diet control  Patient reports no complaints.  Contractions: Not present. Vag. Bleeding: None.  Movement: Present. Denies leaking of fluid.   The following portions of the patient's history were reviewed and updated as appropriate: allergies, current medications, past family history, past medical history, past social history, past surgical history and problem list. Problem list updated.  Objective:   Vitals:   01/16/18 0818  BP: 133/63  Pulse: 89  Weight: 188 lb (85.3 kg)    Fetal Status: Fetal Heart Rate (bpm): 145 Fundal Height: 37 cm Movement: Present  Presentation: Vertex  General:  Alert, oriented and cooperative. Patient is in no acute distress.  Skin: Skin is warm and dry. No rash noted.   Cardiovascular: Normal heart rate noted  Respiratory: Normal respiratory effort, no problems with respiration noted  Abdomen: Soft, gravid, appropriate for gestational age. Pain/Pressure: Present     Pelvic: Vag. Bleeding: None     Cervical exam performed Dilation: Closed Effacement (%): Thick    Extremities: Normal range of motion.  Edema: None  Mental Status: Normal mood and affect. Normal behavior. Normal judgment and thought content.   Urinalysis:      Assessment and Plan:  Pregnancy: G1P0000 at 7073w3d  1. Supervision of normal first pregnancy, antepartum FHT and FH normal - GC/Chlamydia probe amp ([place] - Culture, beta strep  (group b only)  2. Gestational diabetes mellitus (GDM), antepartum, gestational diabetes method of control unspecified Continue diet control. Will hold off treatment as improving diet control. - GC[facility]lam[facility] amp ()[facility] - Culture, beta strep (group b only)  Preterm labor symptoms and general obstetric precautions including but not limited to vaginal bleeding, contractions, leaking of fluid and fetal movement were reviewed in detail with the patient. Please refer to After Visit Summary for other counseling recommendations.  No follow-ups on file.   Levie HeritageStinson, Jacob J, DO

## 2018-01-19 LAB — CULTURE, BETA STREP (GROUP B ONLY): Strep Gp B Culture: POSITIVE — AB

## 2018-01-20 LAB — GC/CHLAMYDIA PROBE AMP (~~LOC~~) NOT AT ARMC
Chlamydia: NEGATIVE
Neisseria Gonorrhea: NEGATIVE

## 2018-01-24 ENCOUNTER — Ambulatory Visit (INDEPENDENT_AMBULATORY_CARE_PROVIDER_SITE_OTHER): Payer: 59 | Admitting: Obstetrics & Gynecology

## 2018-01-24 VITALS — BP 110/59 | HR 82 | Wt 190.1 lb

## 2018-01-24 DIAGNOSIS — Z3400 Encounter for supervision of normal first pregnancy, unspecified trimester: Secondary | ICD-10-CM

## 2018-01-24 DIAGNOSIS — O98513 Other viral diseases complicating pregnancy, third trimester: Secondary | ICD-10-CM

## 2018-01-24 DIAGNOSIS — B009 Herpesviral infection, unspecified: Secondary | ICD-10-CM

## 2018-01-24 DIAGNOSIS — O98519 Other viral diseases complicating pregnancy, unspecified trimester: Secondary | ICD-10-CM

## 2018-01-24 DIAGNOSIS — O24419 Gestational diabetes mellitus in pregnancy, unspecified control: Secondary | ICD-10-CM

## 2018-01-24 DIAGNOSIS — Z3403 Encounter for supervision of normal first pregnancy, third trimester: Secondary | ICD-10-CM

## 2018-01-24 MED ORDER — VALACYCLOVIR HCL 1 G PO TABS: 1000.0000 mg | ORAL_TABLET | Freq: Once | ORAL | 6 refills | Status: AC

## 2018-01-24 MED ORDER — LANSOPRAZOLE 15 MG PO CPDR: 30.0000 mg | DELAYED_RELEASE_CAPSULE | Freq: Every day | ORAL | 3 refills | Status: DC

## 2018-01-24 NOTE — Progress Notes (Signed)
   PRENATAL VISIT NOTE  Subjective:  Tonya Douglas is a 32 y.o. G1P0000 at 923w4d being seen today for ongoing prenatal care.  She is currently monitored for the following issues for this high-risk pregnancy and has Supervision of normal first pregnancy, antepartum; Gestational diabetes mellitus (GDM) affecting pregnancy; Abnormal glucose tolerance test (GTT) during pregnancy, antepartum; and HSV-2 infection complicating pregnancy on their problem list.  Patient reports reflux sx.  Contractions: Not present. Vag. Bleeding: None.  Movement: Present. Denies leaking of fluid.   The following portions of the patient's history were reviewed and updated as appropriate: allergies, current medications, past family history, past medical history, past social history, past surgical history and problem list. Problem list updated.  Objective:   Vitals:   01/24/18 0832  BP: (!) 110/59  Pulse: 82  Weight: 190 lb 1.3 oz (86.2 kg)    Fetal Status: Fetal Heart Rate (bpm): 145   Movement: Present     General:  Alert, oriented and cooperative. Patient is in no acute distress.  Skin: Skin is warm and dry. No rash noted.   Cardiovascular: Normal heart rate noted  Respiratory: Normal respiratory effort, no problems with respiration noted  Abdomen: Soft, gravid, appropriate for gestational age.  Pain/Pressure: Absent     Pelvic: Cervical exam deferred        Extremities: Normal range of motion.  Edema: None  Mental Status: Normal mood and affect. Normal behavior. Normal judgment and thought content.   Assessment and Plan:  Pregnancy: G1P0000 at 5723w4d  1. Supervision of normal first pregnancy, antepartum  2. Gestational diabetes mellitus (GDM) affecting pregnancy All glc levels are doing well  3. Herpes simplex type 2 (HSV-2) infection affecting pregnancy, antepartum Needs Valtrex 1000mg  daily  Term labor symptoms and general obstetric precautions including but not limited to vaginal bleeding,  contractions, leaking of fluid and fetal movement were reviewed in detail with the patient. Please refer to After Visit Summary for other counseling recommendations.  Return in about 1 week (around 01/31/2018).  No future appointments.  Willodean Rosenthalarolyn Harraway-Smith, MD

## 2018-01-31 ENCOUNTER — Ambulatory Visit (INDEPENDENT_AMBULATORY_CARE_PROVIDER_SITE_OTHER): Payer: 59 | Admitting: Family Medicine

## 2018-01-31 VITALS — BP 118/68 | HR 80 | Wt 191.0 lb

## 2018-01-31 DIAGNOSIS — O98513 Other viral diseases complicating pregnancy, third trimester: Secondary | ICD-10-CM

## 2018-01-31 DIAGNOSIS — O24419 Gestational diabetes mellitus in pregnancy, unspecified control: Secondary | ICD-10-CM

## 2018-01-31 DIAGNOSIS — Z3403 Encounter for supervision of normal first pregnancy, third trimester: Secondary | ICD-10-CM

## 2018-01-31 DIAGNOSIS — B009 Herpesviral infection, unspecified: Secondary | ICD-10-CM

## 2018-01-31 DIAGNOSIS — Z3400 Encounter for supervision of normal first pregnancy, unspecified trimester: Secondary | ICD-10-CM

## 2018-01-31 DIAGNOSIS — O98519 Other viral diseases complicating pregnancy, unspecified trimester: Secondary | ICD-10-CM

## 2018-01-31 NOTE — Progress Notes (Signed)
Subjective:  Tonya Douglas is a 32 y.o. G1P0000 at 1534w4d being seen today for ongoing prenatal care.  She is currently monitored for the following issues for this low-risk pregnancy and has Supervision of normal first pregnancy, antepartum; Gestational diabetes mellitus (GDM) affecting pregnancy; Abnormal glucose tolerance test (GTT) during pregnancy, antepartum; and HSV-2 infection complicating pregnancy on their problem list.  GDM: Patient diet controlled Fasting: controlled 2hr PP: 3 elevated into 120s  Patient reports no complaints.  Contractions: Not present. Vag. Bleeding: None.  Movement: Present. Denies leaking of fluid.   The following portions of the patient's history were reviewed and updated as appropriate: allergies, current medications, past family history, past medical history, past social history, past surgical history and problem list. Problem list updated.  Objective:   Vitals:   01/31/18 0906  BP: 118/68  Pulse: 80  Weight: 191 lb (86.6 kg)    Fetal Status: Fetal Heart Rate (bpm): 143 Fundal Height: 39 cm Movement: Present  Presentation: Vertex  General:  Alert, oriented and cooperative. Patient is in no acute distress.  Skin: Skin is warm and dry. No rash noted.   Cardiovascular: Normal heart rate noted  Respiratory: Normal respiratory effort, no problems with respiration noted  Abdomen: Soft, gravid, appropriate for gestational age. Pain/Pressure: Absent     Pelvic: Vag. Bleeding: None     Cervical exam performed Dilation: Fingertip Effacement (%): Thick    Extremities: Normal range of motion.  Edema: Trace  Mental Status: Normal mood and affect. Normal behavior. Normal judgment and thought content.   Urinalysis:      Assessment and Plan:  Pregnancy: G1P0000 at 3134w4d  1. Supervision of normal first pregnancy, antepartum FHT and FH normal  2. Gestational diabetes mellitus (GDM) affecting pregnancy Continue diet control  3. Herpes simplex type 2 (HSV-2)  infection affecting pregnancy, antepartum Valtrex   Term labor symptoms and general obstetric precautions including but not limited to vaginal bleeding, contractions, leaking of fluid and fetal movement were reviewed in detail with the patient. Please refer to After Visit Summary for other counseling recommendations.  No follow-ups on file.   Levie HeritageStinson, Breylan Lefevers J, DO

## 2018-02-03 ENCOUNTER — Ambulatory Visit (HOSPITAL_COMMUNITY)
Admission: RE | Admit: 2018-02-03 | Discharge: 2018-02-03 | Disposition: A | Discharge status: Home / Self Care | Payer: 59 | Source: Ambulatory Visit | Attending: Obstetrics & Gynecology | Admitting: Obstetrics & Gynecology

## 2018-02-03 ENCOUNTER — Encounter (HOSPITAL_COMMUNITY): Payer: Self-pay | Admitting: *Deleted

## 2018-02-03 ENCOUNTER — Telehealth (HOSPITAL_COMMUNITY): Payer: Self-pay | Admitting: *Deleted

## 2018-02-03 DIAGNOSIS — Z3A39 39 weeks gestation of pregnancy: Secondary | ICD-10-CM | POA: Diagnosis not present

## 2018-02-03 DIAGNOSIS — O24419 Gestational diabetes mellitus in pregnancy, unspecified control: Secondary | ICD-10-CM | POA: Diagnosis present

## 2018-02-03 DIAGNOSIS — Z362 Encounter for other antenatal screening follow-up: Secondary | ICD-10-CM

## 2018-02-03 DIAGNOSIS — O98513 Other viral diseases complicating pregnancy, third trimester: Secondary | ICD-10-CM | POA: Diagnosis not present

## 2018-02-03 DIAGNOSIS — O24410 Gestational diabetes mellitus in pregnancy, diet controlled: Secondary | ICD-10-CM | POA: Diagnosis not present

## 2018-02-03 NOTE — Telephone Encounter (Signed)
Preadmission screen  

## 2018-02-05 NOTE — L&D Delivery Note (Signed)
Patient: Tonya Douglas MRN: [id number]  GBS status: Positive, IAP given: PCN   Patient is a 33 y.o. now G1P1 s/p NSVD at [redacted]w[redacted]d, who was admitted for IOL for A1GDM. AROM 1h 35m prior to delivery with clear fluid.    Delivery Note At 12:00 PM a viable female was delivered via Vaginal, Spontaneous (Presentation: ROA).  APGAR: 8, 9; weight 7 lb 4.8 oz (3310 g).   Placenta status: spontaneous, intact.  Cord:  3 vessel with the following complications: none.  Cord pH: N/A  Anesthesia: Lidocaine 10 cc for repair  Episiotomy: None Lacerations: Sulcus Suture Repair: 3.0 vicryl Est. Blood Loss (mL): 150  Head delivered ROA. No nuchal cord present. Shoulder and body delivered in usual fashion. Body cord noted after delivery and reduced. Infant with spontaneous cry, placed on mother's abdomen, dried and bulb suctioned. Cord clamped x 2 after 1-minute delay, and cut by family member. Cord blood drawn. Placenta delivered spontaneously with gentle cord traction. Fundus firm with massage and Pitocin. Perineum and vagina inspected and found to have left sulcus laceration, which was repaired with 3.0 vicryl with good hemostasis achieved.   Mom to postpartum.  Baby to Couplet care / Skin to Skin.  De Hollingshead 02/11/2018, 1:34 PM

## 2018-02-07 ENCOUNTER — Other Ambulatory Visit: Payer: Self-pay | Admitting: Advanced Practice Midwife

## 2018-02-07 ENCOUNTER — Ambulatory Visit (INDEPENDENT_AMBULATORY_CARE_PROVIDER_SITE_OTHER): Payer: 59 | Admitting: Family Medicine

## 2018-02-07 VITALS — BP 129/72 | HR 93 | Wt 191.0 lb

## 2018-02-07 DIAGNOSIS — Z3400 Encounter for supervision of normal first pregnancy, unspecified trimester: Secondary | ICD-10-CM

## 2018-02-07 DIAGNOSIS — O98513 Other viral diseases complicating pregnancy, third trimester: Secondary | ICD-10-CM

## 2018-02-07 DIAGNOSIS — B009 Herpesviral infection, unspecified: Secondary | ICD-10-CM

## 2018-02-07 DIAGNOSIS — O24419 Gestational diabetes mellitus in pregnancy, unspecified control: Secondary | ICD-10-CM

## 2018-02-07 DIAGNOSIS — Z3403 Encounter for supervision of normal first pregnancy, third trimester: Secondary | ICD-10-CM

## 2018-02-07 NOTE — Progress Notes (Signed)
   PRENATAL VISIT NOTE  Subjective:  Tonya Douglas is a 33 y.o. G1P0000 at 3561w4d being seen today for ongoing prenatal care.  She is currently monitored for the following issues for this high-risk pregnancy and has Supervision of normal first pregnancy, antepartum; Gestational diabetes mellitus (GDM) affecting pregnancy; Abnormal glucose tolerance test (GTT) during pregnancy, antepartum; and HSV-2 infection complicating pregnancy on their problem list.  Patient reports no complaints.  Contractions: Not present. Vag. Bleeding: None.  Movement: Present. Denies leaking of fluid.   The following portions of the patient's history were reviewed and updated as appropriate: allergies, current medications, past family history, past medical history, past social history, past surgical history and problem list. Problem list updated.  Objective:   Vitals:   02/07/18 1050  BP: 129/72  Pulse: 93  Weight: 191 lb (86.6 kg)    Fetal Status: Fetal Heart Rate (bpm): 150 Fundal Height: 36 cm Movement: Present  Presentation: Vertex  General:  Alert, oriented and cooperative. Patient is in no acute distress.  Skin: Skin is warm and dry. No rash noted.   Cardiovascular: Normal heart rate noted  Respiratory: Normal respiratory effort, no problems with respiration noted  Abdomen: Soft, gravid, appropriate for gestational age.  Pain/Pressure: Absent     Pelvic: Cervical exam performed Dilation: Closed Effacement (%): 70 Station: -3  Extremities: Normal range of motion.  Edema: Trace  Mental Status: Normal mood and affect. Normal behavior. Normal judgment and thought content.   Assessment and Plan:  Pregnancy: G1P0000 at 761w4d  1. Gestational diabetes mellitus (GDM) affecting pregnancy FBS 68-89 2 hr pp 70-145 (5 of 24 out of range) Continue diet for IOL at 40 wks. EFW 3600 gms Expectations of induction of labor discussed.  2. Supervision of normal first pregnancy, antepartum Continue prenatal  care.   3. Herpes simplex virus type 2 (HSV-2) infection affecting pregnancy in third trimester On valtrex  Term labor symptoms and general obstetric precautions including but not limited to vaginal bleeding, contractions, leaking of fluid and fetal movement were reviewed in detail with the patient. Please refer to After Visit Summary for other counseling recommendations.  Return in 6 weeks (on 03/21/2018).  Future Appointments  Date Time Provider Department Center  02/10/2018  6:30 AM [facility] None  03/17/2018  9:00 AM Levie HeritageStinson, Jacob J, DO [facility] None    Reva Boresanya S Zade Falkner, MD

## 2018-02-07 NOTE — Patient Instructions (Signed)

## 2018-02-10 ENCOUNTER — Encounter (HOSPITAL_COMMUNITY): Payer: Self-pay | Admitting: Certified Registered Nurse Anesthetist

## 2018-02-10 ENCOUNTER — Encounter (HOSPITAL_COMMUNITY): Payer: Self-pay

## 2018-02-10 ENCOUNTER — Inpatient Hospital Stay (HOSPITAL_COMMUNITY)
Admission: RE | Admit: 2018-02-10 | Discharge: 2018-02-13 | DRG: 806 | Disposition: A | Discharge status: Home / Self Care | Payer: 59 | Attending: Obstetrics & Gynecology | Admitting: Obstetrics & Gynecology

## 2018-02-10 ENCOUNTER — Inpatient Hospital Stay (HOSPITAL_COMMUNITY): Admission: AD | Admit: 2018-02-10 | Payer: 59 | Source: Home / Self Care | Admitting: Family Medicine

## 2018-02-10 DIAGNOSIS — Z3A40 40 weeks gestation of pregnancy: Secondary | ICD-10-CM | POA: Diagnosis not present

## 2018-02-10 DIAGNOSIS — B009 Herpesviral infection, unspecified: Secondary | ICD-10-CM | POA: Diagnosis present

## 2018-02-10 DIAGNOSIS — O24420 Gestational diabetes mellitus in childbirth, diet controlled: Secondary | ICD-10-CM | POA: Diagnosis present

## 2018-02-10 DIAGNOSIS — A6000 Herpesviral infection of urogenital system, unspecified: Secondary | ICD-10-CM | POA: Diagnosis present

## 2018-02-10 DIAGNOSIS — O24419 Gestational diabetes mellitus in pregnancy, unspecified control: Secondary | ICD-10-CM | POA: Diagnosis present

## 2018-02-10 DIAGNOSIS — O99824 Streptococcus B carrier state complicating childbirth: Secondary | ICD-10-CM | POA: Diagnosis present

## 2018-02-10 DIAGNOSIS — O98519 Other viral diseases complicating pregnancy, unspecified trimester: Secondary | ICD-10-CM

## 2018-02-10 DIAGNOSIS — O9832 Other infections with a predominantly sexual mode of transmission complicating childbirth: Secondary | ICD-10-CM | POA: Diagnosis present

## 2018-02-10 LAB — CBC
HCT: 38.8 % (ref 36.0–46.0)
Hemoglobin: 12.9 g/dL (ref 12.0–15.0)
MCH: 30.8 pg (ref 26.0–34.0)
MCHC: 33.2 g/dL (ref 30.0–36.0)
MCV: 92.6 fL (ref 80.0–100.0)
Platelets: 223 10*3/uL (ref 150–400)
RBC: 4.19 MIL/uL (ref 3.87–5.11)
RDW: 15.9 % — ABNORMAL HIGH (ref 11.5–15.5)
WBC: 9.3 10*3/uL (ref 4.0–10.5)
nRBC: 0 % (ref 0.0–0.2)

## 2018-02-10 LAB — TYPE AND SCREEN
ABO/RH(D): O POS
Antibody Screen: NEGATIVE

## 2018-02-10 LAB — GLUCOSE, CAPILLARY
Glucose-Capillary: 67 mg/dL — ABNORMAL LOW (ref 70–99)
Glucose-Capillary: 86 mg/dL (ref 70–99)
Glucose-Capillary: 97 mg/dL (ref 70–99)
Glucose-Capillary: 92 mg/dL (ref 70–99)

## 2018-02-10 LAB — RPR: RPR Ser Ql: NONREACTIVE

## 2018-02-10 LAB — ABO/RH: ABO/RH(D): O POS

## 2018-02-10 MED ORDER — OXYCODONE-ACETAMINOPHEN 5-325 MG PO TABS: 2.0000 | ORAL_TABLET | ORAL | Status: DC | PRN

## 2018-02-10 MED ORDER — ACETAMINOPHEN 325 MG PO TABS: 650.0000 mg | ORAL_TABLET | ORAL | Status: DC | PRN

## 2018-02-10 MED ORDER — OXYTOCIN BOLUS FROM INFUSION: 500.0000 mL | Freq: Once | INTRAVENOUS | Status: DC

## 2018-02-10 MED ORDER — OXYTOCIN 40 UNITS IN LACTATED RINGERS INFUSION - SIMPLE MED: 2.5000 [IU]/h | INTRAVENOUS | Status: DC

## 2018-02-10 MED ORDER — TERBUTALINE SULFATE 1 MG/ML IJ SOLN
0.2500 mg | Freq: Once | INTRAMUSCULAR | Status: AC | PRN
  Administered 2018-02-11: 0.25 mg via SUBCUTANEOUS
  Filled 2018-02-10: qty 1

## 2018-02-10 MED ORDER — MISOPROSTOL 50MCG HALF TABLET
50.0000 ug | ORAL_TABLET | ORAL | Status: DC | PRN
  Administered 2018-02-10 (×3): 50 ug via BUCCAL
  Filled 2018-02-10 (×3): qty 1

## 2018-02-10 MED ORDER — LIDOCAINE HCL (PF) 1 % IJ SOLN
30.0000 mL | INTRAMUSCULAR | Status: AC | PRN
  Administered 2018-02-11: 30 mL via SUBCUTANEOUS
  Filled 2018-02-10: qty 30

## 2018-02-10 MED ORDER — FENTANYL CITRATE (PF) 100 MCG/2ML IJ SOLN
100.0000 ug | INTRAMUSCULAR | Status: DC | PRN
  Administered 2018-02-11 (×5): 100 ug via INTRAVENOUS
  Filled 2018-02-10 (×7): qty 2

## 2018-02-10 MED ORDER — SOD CITRATE-CITRIC ACID 500-334 MG/5ML PO SOLN: 30.0000 mL | ORAL | Status: DC | PRN

## 2018-02-10 MED ORDER — SODIUM CHLORIDE 0.9 % IV SOLN
5.0000 10*6.[IU] | Freq: Once | INTRAVENOUS | Status: AC
  Administered 2018-02-10: 5 10*6.[IU] via INTRAVENOUS
  Filled 2018-02-10: qty 5

## 2018-02-10 MED ORDER — PENICILLIN G 3 MILLION UNITS IVPB - SIMPLE MED
3.0000 10*6.[IU] | INTRAVENOUS | Status: DC
  Administered 2018-02-10 – 2018-02-11 (×6): 3 10*6.[IU] via INTRAVENOUS
  Filled 2018-02-10 (×9): qty 100

## 2018-02-10 MED ORDER — OXYCODONE-ACETAMINOPHEN 5-325 MG PO TABS
1.0000 | ORAL_TABLET | ORAL | Status: DC | PRN
  Administered 2018-02-11: 1 via ORAL
  Filled 2018-02-10: qty 1

## 2018-02-10 MED ORDER — LACTATED RINGERS IV SOLN
INTRAVENOUS | Status: DC
  Administered 2018-02-10 – 2018-02-11 (×3): via INTRAVENOUS

## 2018-02-10 MED ORDER — MISOPROSTOL 25 MCG QUARTER TABLET
25.0000 ug | ORAL_TABLET | ORAL | Status: DC | PRN
  Administered 2018-02-10: 25 ug via VAGINAL
  Filled 2018-02-10: qty 1

## 2018-02-10 MED ORDER — LACTATED RINGERS IV SOLN
500.0000 mL | INTRAVENOUS | Status: DC | PRN
  Administered 2018-02-11: 500 mL via INTRAVENOUS

## 2018-02-10 MED ORDER — VALACYCLOVIR HCL 500 MG PO TABS
500.0000 mg | ORAL_TABLET | Freq: Two times a day (BID) | ORAL | Status: DC
  Administered 2018-02-10 – 2018-02-11 (×2): 500 mg via ORAL
  Filled 2018-02-10 (×4): qty 1

## 2018-02-10 MED ORDER — ONDANSETRON HCL 4 MG/2ML IJ SOLN
4.0000 mg | Freq: Four times a day (QID) | INTRAMUSCULAR | Status: DC | PRN
  Administered 2018-02-10: 4 mg via INTRAVENOUS
  Filled 2018-02-10: qty 2

## 2018-02-10 NOTE — Progress Notes (Addendum)
LABOR PROGRESS NOTE  Tonya Douglas is a 33 y.o. G1P0000 at 6138w0d  admitted for scheduled induction for A1 gestational DM.  Subjective: Patient is resting comfortably in bed.  States that she is now experiencing minimal bleeding with spotting.  Dr. Aneta MinsPhillip reattempted twice to insert Foley balloon, but due to cervix tortuous nature was unsuccessful in these attempts.  However, the patient cervix is now dilated to 2 cm and 60% effaced.  Objective: BP 127/62   Pulse 80   Temp 98.2 F (36.8 C) (Oral)   Resp 20   Ht 5\' 4"  (1.626 m)   Wt 86.6 kg   LMP 05/06/2017   BMI 32.79 kg/m  or  Vitals:   02/10/18 0700 02/10/18 0721 02/10/18 1210 02/10/18 1550  BP: 139/81  130/64 127/62  Pulse: (!) 105  89 80  Resp: 16  18 20   Temp: 98 F (36.7 C)  98.7 F (37.1 C) 98.2 F (36.8 C)  TempSrc: Oral  Oral Oral  Weight:  86.6 kg    Height:  5\' 4"  (1.626 m)     Dilation: 2 Effacement (%): 60 Station: -3 Presentation: Vertex Exam by:: Becton, Dickinson and CompanyPhillip FHT: baseline rate 135, moderate varibility, 15x15acel, - decel Toco: Cytotec 50 mcg x 3  Labs: Lab Results  Component Value Date   WBC 9.3 02/10/2018   HGB 12.9 02/10/2018   HCT 38.8 02/10/2018   MCV 92.6 02/10/2018   PLT 223 02/10/2018    Patient Active Problem List   Diagnosis Date Noted  . Gestational diabetes mellitus (GDM) in third trimester 02/10/2018  . HSV-2 infection complicating pregnancy 12/16/2017  . Abnormal glucose tolerance test (GTT) during pregnancy, antepartum 12/04/2017  . Gestational diabetes mellitus (GDM) affecting pregnancy 11/24/2017  . Supervision of normal first pregnancy, antepartum 07/29/2017    Assessment / Plan: 33 y.o. G1P0000 at 5838w0d here for scheduled induction of labor for A1 gestational DM.  Labor: Latent-active Fetal Wellbeing: Category 1 Pain Control: None Anticipated MOD: Vaginal  Tonya ShoalsHannah Anderson, DO 02/10/2018 6:35 PM   OB FELLOW LABOR PROGRESS NOTE ATTESTATION  I have seen and examined this  patient and agree with above documentation in the resident's note.   Tonya AbbotNimeka Savahanna Almendariz, MD  OB Fellow  02/11/2018, 4:41 PM

## 2018-02-10 NOTE — Progress Notes (Signed)
OB/[facility]N Faculty Practice: Labor Progress Note  Subjective: Doing well, more crampy. Trying to move more around room.   Objective: BP 118/61   Pulse 87   Temp 97.7 F (36.5 C) (Oral)   Resp 17   Ht 5\' 4"  (1.626 m)   Wt 86.6 kg   LMP 05/06/2017   BMI 32.79 kg/m  Gen: well-appearing, NAD Dilation: 2 Effacement (%): 50 Station: -3 Presentation: Vertex Exam by:: Earlene Plater, MD  Assessment and Plan: 33 y.o. G1P0000 [redacted]w[redacted]d here for IOL for A1GDM.  Labor: Induction started this morning with cytotec buccal. 2 prior FB attempts, 3rd attempt done with [facility] catheter and successful.  -- vaginal cytotec given -- pain control: planning for nitrous, IV -- PPH Risk: low  Fetal Well-Being: EFW 85%, 8-9lbs by Leopold's. Cephalic by sutures.  -- Category I - continuous fetal monitoring  -- GBS positive   A1GDM: BG stable. Continue q4 BG checks.    Cristal Deer. Earlene Plater, DO OB/GYN Fellow, Faculty Practice  9:51 PM

## 2018-02-10 NOTE — H&P (Addendum)
LABOR AND DELIVERY ADMISSION HISTORY AND PHYSICAL NOTE  Tonya Douglas is a 33 y.o. female G1P0000 with IUP at 17w0dby LMP presenting for scheduled induction for A1Gestational DM.   She reports positive fetal movement. She denies leakage of fluid or vaginal bleeding.  Patient has had HSV2 outbreaks q3 months, currently on Valtrex 1g daily since 2 weeks. Last dose yesterday. Diagnosed in 2013.   [facility]:  - [facility] A[facility]  Past Medical History: Past Medical History:  Diagnosis Date  . 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 Abnormal Pap smear 02/2010   LSIL, COLPO Neg  . STD (sexually transmitted disease) 06/2009   + chlam   . STD (sexually transmitted disease) 03/29/11   + HSV II proven C&S vulva  . 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 COLPOSCOPY  07/2009   CIN I    Obstetrical History: OB History    Gravida  1   Para  0   Term  0   Preterm  0   AB  0   Living  0     SAB  0   TAB  0   Ectopic  0   Multiple  0   Live Births  0           Social History: Social History   Socioeconomic History  . Marital status: Single    Spouse name: Not on file  . Number of children: Not on file  . Years of education: Not on file  . Highest education level: Not on file  Occupational History  . Not on file  Social Needs  . Financial resource strain: Not hard at all  . Food insecurity:    Worry: Never true    Inability: Never true  . Transportation needs:    Medical: No    Non-medical: Not on file  Tobacco Use  . Smoking status: Never Smoker  . Smokeless tobacco: Never Used  Substance and Sexual Activity  . Alcohol use: Yes    Alcohol/week: 2.0 standard drinks    Types: 2 Standard drinks or equivalent per week  . Drug  use: No  . Sexual activity: Yes    Partners: Male    Birth control/protection: Pill  Lifestyle  . Physical activity:    Days per week: Not on file    Minutes per session: Not on file  . Stress: Not at all  Relationships  . Social connections:    Talks on phone: Not on file    Gets together: Not on file    Attends religious service: Not on file    Active member of club or organization: Not on file    Attends meetings of clubs or organizations: Not on file    Relationship status: Not on file  Other Topics Concern  . Not on file  Social History Narrative  . Not on file    Family History: Family History  Problem Relation Age of Onset  . Hypertension Father   Hypertension - Mom  Allergies:  No Known Allergies  Medications Prior to Admission  Medication Sig Dispense Refill Last Dose  . Prenatal Multivit-Min-Fe-FA (PRE-NATAL FORMULA PO) Take by mouth.   02/09/2018 at Unknown time  . Probiotic Product (PROBIOTIC-10) CAPS Take by  mouth.   02/09/2018 at Unknown time  . valACYclovir (VALTREX) 500 MG tablet Take 1 tablet (500 mg total) by mouth 2 (two) times daily. Can increase to BID for 3 days as needed 60 tablet 4 02/10/2018 at Unknown time  . ACCU-CHEK FASTCLIX LANCETS MISC 1 Units by Percutaneous route 4 (four) times daily. (Patient not taking: Reported on 02/10/2018) 100 each 12 Not Taking at Unknown time  . Blood Glucose Monitoring Suppl (ACCU-CHEK AVIVA PLUS) w/Device KIT 1 Device by Does not apply route 4 (four) times daily. DX: O24.419 Check BS QID. (Patient not taking: Reported on 02/10/2018) 1 kit 0 Not Taking at Unknown time  . glucose blood test strip DX: O24.419.Check BS four times a day. (Patient not taking: Reported on 02/10/2018) 100 each 12 Not Taking at Unknown time  . lansoprazole (PREVACID) 15 MG capsule Take 2 capsules (30 mg total) by mouth daily at 12 noon. (Patient not taking: Reported on 01/31/2018) 60 capsule 3 Not Taking at Unknown time   Review of Systems  All systems  reviewed and negative except as stated in HPI  Physical Exam Blood pressure 139/81, pulse (!) 105, temperature 98 F (36.7 C), temperature source Oral, resp. rate 16, height _0  (1.626 m), weight 86.6 kg, last menstrual period 05/06/2017. General appearance: alert, oriented, NAD Lungs: normal respiratory effort Heart: regular rate Abdomen: soft, non-tender; gravid, FH appropriate for GA Extremities: No calf swelling or tenderness Presentation: cephalic Fetal monitoring: Cat 1 Uterine activity: Baseline 130, Mod Variability, + Acc, - Decels Dilation: Fingertip Effacement (%): 70 Station: -3 Exam by:: Casilda Carls RN  Prenatal labs: ABO, Rh: --/--/O POS (01/06 9476) Antibody: NEG (01/06 0704) Rubella: <0.90 (06/24 0847) RPR: Non Reactive (10/17 1000)  HBsAg: Negative (06/24 0847)  HIV: Non Reactive (10/17 1000)  GC/Chlamydia: Negative GBS: Positive (12/12 0000)  2-hr GTT: 172 Genetic screening:  Negative Anatomy US: WNL, female  Prenatal Transfer Tool  Maternal Diabetes: Yes:  Diabetes Type:  Diet controlled Genetic Screening: Normal Maternal Ultrasounds/Referrals: Normal Fetal Ultrasounds or other Referrals:  None Maternal Substance Abuse:  No Significant Maternal Medications:  Meds include: Other: valtre4x, prenatal, probiotic Significant Maternal Lab Results: None  Results for orders placed or performed during the hospital encounter of 02/10/18 (from the past 24 hour(s))  CBC   Collection Time: 02/10/18  7:04 AM  Result Value Ref Range   WBC 9.3 4.0 - 10.5 K/uL   RBC 4.19 3.87 - 5.11 MIL/uL   Hemoglobin 12.9 12.0 - 15.0 g/dL   HCT 38.8 36.0 - 46.0 %   MCV 92.6 80.0 - 100.0 fL   MCH 30.8 26.0 - 34.0 pg   MCHC 33.2 30.0 - 36.0 g/dL   RDW 15.9 (H) 11.5 - 15.5 %   Platelets 223 150 - 400 K/uL   nRBC 0.0 0.0 - 0.2 %  Type and screen   Collection Time: 02/10/18  7:04 AM  Result Value Ref Range   ABO/RH(D) O POS    Antibody Screen NEG    Sample Expiration       02/13/2018 Performed at [facility], [address] [place], [place] [address]   Glucose, capillary   Collection Time: 02/10/18  7:40 AM  Result Value Ref Range   Glucose-Capillary 67 (L) 70 - 99 mg/dL    Patient Active Problem List   Diagnosis Date Noted  . Gestational diabetes mellitus (GDM) in third trimester 02/10/2018  . HSV-2 infection complicating pregnancy 35/46/5681  . Abnormal glucose tolerance test (  GTT) during pregnancy, antepartum 12/04/2017  . Gestational diabetes mellitus (GDM) affecting pregnancy 11/24/2017  . Supervision of normal first pregnancy, antepartum 07/29/2017    Assessment: Cagney Steenson is a 33 y.o. G1P0000 at 48w0dhere for IOL for GDM A1.   #Labor: Latent #Pain: 0/10 #FWB: Cat I #ID:  GBS+ s/p PCN, HSV s/p valtrex #MOF: Breast #MOC:Mini-Pill #Circ:  Yes, Inpatient #GDM: EFW 8lb1oz 85% 12/30. CBGs Q4h during latent labor and Q2h during active labor.   HDaisy Floro1/07/2018, 10:23 AM   OB FELLOW HISTORY AND PHYSICAL ATTESTATION  I have seen and examined this patient; I agree with above documentation in the resident's note.   NAura Camps MD  OB Fellow  02/10/2018, 12:47 PM

## 2018-02-10 NOTE — Progress Notes (Signed)
LABOR PROGRESS NOTE  Lucylle Burkette is a 33 y.o. G1P0000 at [redacted]w[redacted]d  admitted for IOL for A1GDM  Subjective: Feeling comfortable Agreeable to foley bulb placement  Objective: BP 130/64   Pulse 89   Temp 98.7 F (37.1 C) (Oral)   Resp 18   Ht 5\' 4"  (1.626 m)   Wt 86.6 kg   LMP 05/06/2017   BMI 32.79 kg/m  or  Vitals:   02/10/18 0700 02/10/18 0721 02/10/18 1210  BP: 139/81  130/64  Pulse: (!) 105  89  Resp: 16  18  Temp: 98 F (36.7 C)  98.7 F (37.1 C)  TempSrc: Oral  Oral  Weight:  86.6 kg   Height:  5\' 4"  (1.626 m)     Dilation: 1 Effacement (%): 70 Station: -3 Presentation: Vertex Exam by:: [employer]: baseline rate 130, moderate varibility, + acel, no decel Toco: irritability and irregular contractions  Labs: Lab Results  Component Value Date   WBC 9.3 02/10/2018   HGB 12.9 02/10/2018   HCT 38.8 02/10/2018   MCV 92.6 02/10/2018   PLT 223 02/10/2018    Patient Active Problem List   Diagnosis Date Noted  . Gestational diabetes mellitus (GDM) in third trimester 02/10/2018  . HSV-2 infection complicating pregnancy 12/16/2017  . Abnormal glucose tolerance test (GTT) during pregnancy, antepartum 12/04/2017  . Gestational diabetes mellitus (GDM) affecting pregnancy 11/24/2017  . Supervision of normal first pregnancy, antepartum 07/29/2017    Assessment / Plan: 33 y.o. G1P0000 at [redacted]w[redacted]d here for IOL for A1GDM  Labor: attempt to place FB unsuccessful. Will give 2nd dose of cytotec and attempt again with next check Fetal Wellbeing:  Cat 1 Pain Control:  Comfortable now Anticipated MOD:  SVD  Gwenevere Abbot, MD  OB Fellow  02/10/2018, 12:49 PM

## 2018-02-10 NOTE — Anesthesia Pain Management Evaluation Note (Signed)
  CRNA Pain Management Visit Note  Patient: Tonya Douglas, 33 y.o., female  "Hello I am a member of the anesthesia team at [facility]. We have an anesthesia team available at all times to provide care throughout the hospital, including epidural management and anesthesia for C-section. I don't know your plan for the delivery whether it a natural birth, water birth, IV sedation, nitrous supplementation, doula or epidural, but we want to meet your pain goals."   1.Was your pain managed to your expectations on prior hospitalizations?   No prior hospitalizations  2.What is your expectation for pain management during this hospitalization?     Epidural  3.How can we help you reach that goal?   Record the patient's initial score and the patient's pain goal.   Pain: 0  Pain Goal: 5 [facility] wants you to be able to say your pain was always managed very well.  Laban Emperor 02/10/2018

## 2018-02-11 ENCOUNTER — Encounter (HOSPITAL_COMMUNITY): Payer: Self-pay

## 2018-02-11 DIAGNOSIS — O99824 Streptococcus B carrier state complicating childbirth: Secondary | ICD-10-CM

## 2018-02-11 DIAGNOSIS — O24420 Gestational diabetes mellitus in childbirth, diet controlled: Secondary | ICD-10-CM

## 2018-02-11 DIAGNOSIS — Z3A40 40 weeks gestation of pregnancy: Secondary | ICD-10-CM

## 2018-02-11 LAB — GLUCOSE, CAPILLARY
Glucose-Capillary: 104 mg/dL — ABNORMAL HIGH (ref 70–99)
Glucose-Capillary: 114 mg/dL — ABNORMAL HIGH (ref 70–99)
Glucose-Capillary: 120 mg/dL — ABNORMAL HIGH (ref 70–99)
Glucose-Capillary: 121 mg/dL — ABNORMAL HIGH (ref 70–99)

## 2018-02-11 MED ORDER — BENZOCAINE-MENTHOL 20-0.5 % EX AERO
1.0000 "application " | INHALATION_SPRAY | CUTANEOUS | Status: DC | PRN
  Filled 2018-02-11: qty 56

## 2018-02-11 MED ORDER — PHENYLEPHRINE 40 MCG/ML (10ML) SYRINGE FOR IV PUSH (FOR BLOOD PRESSURE SUPPORT)
80.0000 ug | PREFILLED_SYRINGE | INTRAVENOUS | Status: DC | PRN
  Filled 2018-02-11: qty 10

## 2018-02-11 MED ORDER — OXYTOCIN 40 UNITS IN NORMAL SALINE INFUSION - SIMPLE MED
1.0000 m[IU]/min | INTRAVENOUS | Status: DC
  Administered 2018-02-11: 8 m[IU]/min via INTRAVENOUS
  Filled 2018-02-11: qty 1000

## 2018-02-11 MED ORDER — SENNOSIDES-DOCUSATE SODIUM 8.6-50 MG PO TABS
2.0000 | ORAL_TABLET | ORAL | Status: DC
  Administered 2018-02-12 (×2): 2 via ORAL
  Filled 2018-02-11 (×2): qty 2

## 2018-02-11 MED ORDER — DEXTROSE IN LACTATED RINGERS 5 % IV SOLN: INTRAVENOUS | Status: DC

## 2018-02-11 MED ORDER — WITCH HAZEL-GLYCERIN EX PADS: 1.0000 "application " | MEDICATED_PAD | CUTANEOUS | Status: DC | PRN

## 2018-02-11 MED ORDER — SIMETHICONE 80 MG PO CHEW: 80.0000 mg | CHEWABLE_TABLET | ORAL | Status: DC | PRN

## 2018-02-11 MED ORDER — DIBUCAINE 1 % RE OINT: 1.0000 "application " | TOPICAL_OINTMENT | RECTAL | Status: DC | PRN

## 2018-02-11 MED ORDER — MEASLES, MUMPS & RUBELLA VAC IJ SOLR
0.5000 mL | Freq: Once | INTRAMUSCULAR | Status: DC
  Filled 2018-02-11: qty 0.5

## 2018-02-11 MED ORDER — PRENATAL MULTIVITAMIN CH
1.0000 | ORAL_TABLET | Freq: Every day | ORAL | Status: DC
  Administered 2018-02-12: 1 via ORAL
  Filled 2018-02-11: qty 1

## 2018-02-11 MED ORDER — ONDANSETRON HCL 4 MG PO TABS: 4.0000 mg | ORAL_TABLET | ORAL | Status: DC | PRN

## 2018-02-11 MED ORDER — EPHEDRINE 5 MG/ML INJ
10.0000 mg | INTRAVENOUS | Status: DC | PRN
  Filled 2018-02-11: qty 2

## 2018-02-11 MED ORDER — OXYTOCIN BOLUS FROM INFUSION
500.0000 mL | Freq: Once | INTRAVENOUS | Status: AC
  Administered 2018-02-11: 500 mL via INTRAVENOUS

## 2018-02-11 MED ORDER — ONDANSETRON HCL 4 MG/2ML IJ SOLN: 4.0000 mg | INTRAMUSCULAR | Status: DC | PRN

## 2018-02-11 MED ORDER — OXYTOCIN 40 UNITS IN NORMAL SALINE INFUSION - SIMPLE MED: 2.5000 [IU]/h | INTRAVENOUS | Status: DC

## 2018-02-11 MED ORDER — DIPHENHYDRAMINE HCL 25 MG PO CAPS: 25.0000 mg | ORAL_CAPSULE | Freq: Four times a day (QID) | ORAL | Status: DC | PRN

## 2018-02-11 MED ORDER — LACTATED RINGERS IV SOLN: 500.0000 mL | Freq: Once | INTRAVENOUS | Status: DC

## 2018-02-11 MED ORDER — FENTANYL 2.5 MCG/ML BUPIVACAINE 1/10 % EPIDURAL INFUSION (WH - ANES): 14.0000 mL/h | INTRAMUSCULAR | Status: DC | PRN

## 2018-02-11 MED ORDER — ZOLPIDEM TARTRATE 5 MG PO TABS: 5.0000 mg | ORAL_TABLET | Freq: Every evening | ORAL | Status: DC | PRN

## 2018-02-11 MED ORDER — IBUPROFEN 600 MG PO TABS
600.0000 mg | ORAL_TABLET | Freq: Four times a day (QID) | ORAL | Status: DC
  Administered 2018-02-11 – 2018-02-13 (×7): 600 mg via ORAL
  Filled 2018-02-11 (×7): qty 1

## 2018-02-11 MED ORDER — TETANUS-DIPHTH-ACELL PERTUSSIS 5-2.5-18.5 LF-MCG/0.5 IM SUSP: 0.5000 mL | Freq: Once | INTRAMUSCULAR | Status: DC

## 2018-02-11 MED ORDER — OXYTOCIN 40 UNITS IN LACTATED RINGERS INFUSION - SIMPLE MED
1.0000 m[IU]/min | INTRAVENOUS | Status: DC
  Administered 2018-02-11: 2 m[IU]/min via INTRAVENOUS
  Filled 2018-02-11: qty 1000

## 2018-02-11 MED ORDER — DIPHENHYDRAMINE HCL 50 MG/ML IJ SOLN: 12.5000 mg | INTRAMUSCULAR | Status: DC | PRN

## 2018-02-11 MED ORDER — ACETAMINOPHEN 325 MG PO TABS: 650.0000 mg | ORAL_TABLET | ORAL | Status: DC | PRN

## 2018-02-11 MED ORDER — COCONUT OIL OIL: 1.0000 "application " | TOPICAL_OIL | Status: DC | PRN

## 2018-02-11 MED ORDER — INSULIN REGULAR(HUMAN) IN NACL 100-0.9 UT/100ML-% IV SOLN
INTRAVENOUS | Status: DC
  Filled 2018-02-11: qty 100

## 2018-02-11 NOTE — Progress Notes (Signed)
OB/GYN Faculty Practice: Labor Progress Note  Subjective: In bathroom.   Objective: BP 125/71   Pulse (!) 116   Temp 98.1 F (36.7 C) (Oral)   Resp 18   Ht 5\' 4"  (1.626 m)   Wt 86.6 kg   LMP 05/06/2017   SpO2 100%   BMI 32.79 kg/m  Gen: uncomfortable appearing  Dilation: 5 Effacement (%): 70 Cervical Position: Middle Station: -3 Presentation: Vertex Exam by:: Beitz RN   Assessment and Plan: 33 y.o. G1P0000 [redacted]w[redacted]d here for IOL for A1GDM.  Labor: Tracing much improved after terbutaline and able to start pitocin around 0340. Will continue to titrate pitocin per protocol. Consider AROM with next check.  -- pain control: planning for nitrous, IV -- PPH Risk: low  Fetal Well-Being: EFW 85%, 8-9lbs by Leopold's. Cephalic by sutures.  -- Category I - continuous fetal monitoring  -- GBS positive   A1GDM: BG stable. Continue q4 BG checks. BG 120>121>104. Had ordered glucose stabilizer but then normal on recheck so continue to check q4hr.   Cristal Deer. Earlene Plater, DO OB/GYN Fellow, Faculty Practice  7:21 AM

## 2018-02-11 NOTE — Progress Notes (Signed)
OB/GYN Faculty Practice: Labor Progress Note  Subjective: Discussed plan of care with RN. Patient continuing to have recurrent variables. Feeling contractions nearly every minute, often getting up and going to bathroom. FB now out.   Objective: BP 117/83   Pulse (!) 119   Temp 97.8 F (36.6 C) (Oral)   Resp 16   Ht 5\' 4"  (1.626 m)   Wt 86.6 kg   LMP 05/06/2017   SpO2 100%   BMI 32.79 kg/m  Gen: uncomfortable appearing  Dilation: 4 Effacement (%): 70 Cervical Position: Middle Station: -3 Presentation: Vertex Exam by:: Earlene PlaterWallace, DO  Assessment and Plan: 33 y.o. G1P0000 2968w0d here for IOL for A1GDM.  Labor: FB out, received cytotec x 4. Contracting every minute with continued variable decelerations. Will give one dose of terbutaline. Monitor and assuming improved tracing, will likely start pitocin. BBOW on exam so may also proceed with AROM and possible IUPC/amnioinfusion.  -- pain control: planning for nitrous, IV -- PPH Risk: low  Fetal Well-Being: EFW 85%, 8-9lbs by Leopold's. Cephalic by sutures.  -- Category I - continuous fetal monitoring  -- GBS positive   A1GDM: BG stable. Continue q4 BG checks. BG 120 but had recently had some gatorade.  Cristal DeerLaurel S. Earlene PlaterWallace, DO OB/GYN Fellow, Faculty Practice  2:17 AM

## 2018-02-11 NOTE — Lactation Note (Addendum)
This note was copied from a baby's chart. Lactation Consultation Note  Patient Name: Tonya Douglas [id number] Date: 02/11/2018 Reason for consult: Initial assessment;Primapara;1st time breastfeeding;Term;Maternal endocrine disorder Type of Endocrine Disorder?: Diabetes(diet controlled)  5 hours FT female who is being exclusively BF by his mother, she's a P1. Mom took a BF class here at [facility] and she already knows how to hand express. When [facility] assisted with hand expression, colostrum was easily expressed out of her right breast, LC rubbed it on baby's mouth. Mom has large breasts with everted nipples and both looked intact upon examination. She has a Medela DEBP at home.  Mom was doing STS with baby when entering the room, offered assistance with latch but mom politely declined stating that she was told to keep baby STS for at least an hour due to low temperatures. Asked mom to call for latch assistance when needed, per mom BF is going well; she has a LATCH score of 8 on his last feeding. Discussed cluster feeding, sleeping cycle and normal newborn behavior.  Feeding plan:  1. Encouraged mom to feed baby STS 8-12 times/24 hours or sooner if feeding cues are present 2. Hand expression and spoon/finger feeding was also encouraged  BF brochure, BF resources and feeding diary were reviewed. Dad present but not involved in [facility] consultation, he was on his phone the entire time. Mom reported all questions and concerns were answered, she's aware of LC services and will call PRN.  Maternal Data Formula Feeding for Exclusion: No Has patient been taught Hand Expression?: Yes Does the patient have breastfeeding experience prior to this delivery?: No  Feeding Feeding Type: Breast Fed  LATCH Score Latch: Grasps breast easily, tongue down, lips flanged, rhythmical sucking.  Audible Swallowing: A few with stimulation  Type of Nipple: Everted at rest and after stimulation  Comfort (Breast/Nipple): Soft /  non-tender  Hold (Positioning): Assistance needed to correctly position infant at breast and maintain latch.  LATCH Score: 8  Interventions Interventions: Breast feeding basics reviewed;Breast massage;Hand express;Breast compression  Lactation Tools Discussed/Used WIC Program: No   Consult Status Consult Status: Follow-up Date: 02/12/18 Follow-up type: In-patient    Elman Dettman Venetia Constable 02/11/2018, 5:29 PM

## 2018-02-11 NOTE — Discharge Summary (Addendum)
OB Discharge Summary     Patient Name: Tonya Douglas DOB: 1985-09-10 MRN: [id number]  Date of admission: 02/10/2018 Delivering MD: Nicolette Bang   Date of discharge: 02/13/2018  Admitting diagnosis: INDUCTION Intrauterine pregnancy: [redacted]w[redacted]d    Secondary diagnosis:  Active Problems:   HSV-2 infection complicating pregnancy   Gestational diabetes mellitus (GDM) in third trimester  Additional problems: None     Discharge diagnosis: Term Pregnancy Delivered and GDM A1                                                                                                Post partum procedures:None  Augmentation: AROM, Pitocin, Cytotec and Foley Balloon  Complications: None  Hospital course:  Induction of Labor With Vaginal Delivery   33y.o. yo G1P0000 at 430w1das admitted to the hospital 02/10/2018 for induction of labor.  Indication for induction: A1 DM.  Patient had an uncomplicated labor course as follows: Membrane Rupture Time/Date: 10:20 AM ,02/11/2018   Intrapartum Procedures: Episiotomy: None [1]                                         Lacerations:  Sulcus [9]  Patient had delivery of a Viable infant.  Information for the patient's newborn:  LoVinessa, Macconnell0[[id number]]Delivery Method: Vag-Spont   02/11/2018  Details of delivery can be found in separate delivery note.  Patient had a routine postpartum course. Patient is discharged home 02/13/18.  Physical exam  Vitals:   02/12/18 0430 02/12/18 1422 02/12/18 2238 02/13/18 0536  BP: 114/77 (!) 120/59 110/69 106/68  Pulse: 82 87 75 82  Resp: 18 18 16 18   Temp: 97.6 F (36.4 C) 98 F (36.7 C) (!) 97.5 F (36.4 C) 98.1 F (36.7 C)  TempSrc: Oral  Oral Oral  SpO2: 100%  99% 99%  Weight:      Height:       General: alert, cooperative and no distress Lochia: appropriate Uterine Fundus: firm Incision: N/A DVT Evaluation: No evidence of DVT seen on physical exam. Negative Homan's sign. No cords or calf  tenderness. No significant calf/ankle edema. Labs: Lab Results  Component Value Date   WBC 9.3 02/10/2018   HGB 12.9 02/10/2018   HCT 38.8 02/10/2018   MCV 92.6 02/10/2018   PLT 223 02/10/2018   CMP Latest Ref Rng & Units 11/29/2016  Glucose 65 - 99 mg/dL 91  BUN 6 - 20 mg/dL 18  Creatinine 0.57 - 1.00 mg/dL 0.95  Sodium 134 - 144 mmol/L 140  Potassium 3.5 - 5.2 mmol/L 4.3  Chloride 96 - 106 mmol/L 102  CO2 20 - 29 mmol/L 24  Calcium 8.7 - 10.2 mg/dL 9.6  Total Protein 6.0 - 8.5 g/dL 7.4  Total Bilirubin 0.0 - 1.2 mg/dL 0.3  Alkaline Phos 39 - 117 IU/L 69  AST 0 - 40 IU/L 24  ALT 0 - 32 IU/L 18   Discharge instruction: per After Visit Summary and "Baby and Me  Booklet".  After visit meds:  Allergies as of 02/13/2018   No Known Allergies     Medication List    STOP taking these medications   ACCU-CHEK AVIVA PLUS w/Device Kit   ACCU-CHEK FASTCLIX LANCETS Misc   glucose blood test strip   lansoprazole 15 MG capsule Commonly known as:  PREVACID     TAKE these medications   ibuprofen 600 MG tablet Commonly known as:  ADVIL,MOTRIN Take 1 tablet (600 mg total) by mouth every 6 (six) hours.   PRE-NATAL FORMULA PO Take by mouth.   PROBIOTIC-10 Caps Take by mouth.   senna-docusate 8.6-50 MG tablet Commonly known as:  Senokot-S Take 2 tablets by mouth daily. Start taking on:  February 14, 2018   valACYclovir 500 MG tablet Commonly known as:  VALTREX Take 1 tablet (500 mg total) by mouth 2 (two) times daily. Can increase to BID for 3 days as needed       Diet: carb modified diet  Activity: Advance as tolerated. Pelvic rest for 6 weeks.   Outpatient follow up:2 weeks for A1GDM Follow up Appt: Future Appointments  Date Time Provider [place]  03/17/2018  9:00 AM Truett Mainland, DO [facility] None   Follow up Visit:No follow-ups on file.  Postpartum contraception: Progesterone only pills   Newborn Data: Live born female  Birth Weight:   APGAR:  69, 9  Newborn Delivery   Birth date/time:  02/11/2018 12:00:00 Delivery type:  Vaginal, Spontaneous     Baby Feeding: Breast Disposition:home with mother   02/13/2018 Daisy Floro, DO  OB FELLOW DISCHARGE ATTESTATION  I have seen and examined this patient and agree with above documentation in the resident's note.   Phill Myron, D.O. OB Fellow  02/13/2018, 9:47 PM

## 2018-02-12 LAB — GLUCOSE, CAPILLARY
GLUCOSE-CAPILLARY: 82 mg/dL (ref 70–99)
Glucose-Capillary: 66 mg/dL — ABNORMAL LOW (ref 70–99)

## 2018-02-12 NOTE — Progress Notes (Addendum)
POSTPARTUM PROGRESS NOTE  Post Partum Day 1  Subjective:  Tonya Douglas is a 33 y.o. G1P1001 s/p SVD at 6533w1d.  She reports she is doing well. No acute events overnight. She denies any problems with ambulating, voiding or po intake. Denies nausea or vomiting.  Pain is well controlled.  Lochia is mild.  Objective: Blood pressure 114/77, pulse 82, temperature 97.6 F (36.4 C), temperature source Oral, resp. rate 18, height 5\' 4"  (1.626 m), weight 86.6 kg, last menstrual period 05/06/2017, SpO2 100 %, unknown if currently breastfeeding.  Physical Exam:  General: alert, cooperative and no distress Chest: no respiratory distress Heart:regular rate, distal pulses intact Abdomen: soft, nontender,  Uterine Fundus: firm, appropriately tender DVT Evaluation: No calf swelling or tenderness Extremities: no edema Skin: warm, dry  Recent Labs    02/10/18 0704  HGB 12.9  HCT 38.8    Assessment/Plan: Tonya Douglas is a 33 y.o. G1P1001 s/p SVD at 5633w1d. She experienced A1GDM during pregancy with normal blood glucose this morning.  PPD#1 - Doing well  Routine postpartum care  Continue to monitor blood glucose Contraception: POP, would like to f/u with OB after d/c  Feeding: Breast Dispo: Plan for discharge tomorrow.   LOS: 2 days   Genene ChurnJohn Cook, MS3 02/12/2018, 9:42 AM   OB FELLOW POSTPARTUM PROGRESS NOTE ATTESTATION  I have seen and examined this patient and agree with above documentation in the resident's note.   Tonya AbbotNimeka Zakyria Metzinger, MD  OB Fellow  02/12/2018, 8:43 PM

## 2018-02-12 NOTE — Progress Notes (Signed)
Hypoglycemic Event  CBG: 66  Treatment: 4 oz juice/soda  Symptoms: None  Follow-up CBG: Time:0501 CBG Result:82  Possible Reasons for Event: Inadequate meal intake, has not eaten since dinner last night  Comments/MD notified:patient asymptomatic     Anh Mangano Q Leman Martinek

## 2018-02-13 MED ORDER — IBUPROFEN 600 MG PO TABS: 600.0000 mg | ORAL_TABLET | Freq: Four times a day (QID) | ORAL | 0 refills | Status: DC

## 2018-02-13 MED ORDER — SENNOSIDES-DOCUSATE SODIUM 8.6-50 MG PO TABS: 2.0000 | ORAL_TABLET | ORAL | 0 refills | Status: DC

## 2018-02-13 NOTE — Lactation Note (Signed)
This note was copied from a baby's chart. Lactation Consultation Note  Patient Name: Tonya Douglas OMVEH'M Date: 02/13/2018 Reason for consult: Follow-up assessment;Primapara;1st time breastfeeding;Term;Infant weight loss;Nipple pain/trauma Type of Endocrine Disorder?: Diabetes  Baby is 48 hours  Mom ready for D/C - asking for hand pump and mentioned she is having some soreness/ nipples ' LC offered to assess and mom receptive - no breakdown noted/ compressible areolas / milk coming  In and fuller. LC instructed mom on use shells / comfort gels and hand pump ( #27 F provided for when milk comes  In ).  Reviewed sore nipple and engorgement prevention and treatment reviewed .  Mother informed of post-discharge support and given phone number to the lactation department, including services for phone call assistance; out-patient appointments; and breastfeeding support group. List of other breastfeeding resources in the community given in the handout. Encouraged mother to call for problems or concerns related to breastfeeding.   Maternal Data Has patient been taught Hand Expression?: Yes  Feeding Feeding Type: (last fed at 1035 )  LATCH Score                   Interventions Interventions: Breast feeding basics reviewed;Shells;Comfort gels;Hand pump  Lactation Tools Discussed/Used Tools: Shells;Pump;Flanges;Comfort gels Flange Size: 24;27 Shell Type: Inverted Breast pump type: Manual Pump Review: Setup, frequency, and cleaning;Milk Storage(per mom was already reviewed from booklet ) Initiated by:: MAI  Date initiated:: 02/13/18   Consult Status Consult Status: Follow-up Date: (per mom [place] office has a LC ) Follow-up type: Out-patient    Matilde Sprang Lisvet Rasheed 02/13/2018, 12:16 PM

## 2018-02-27 ENCOUNTER — Ambulatory Visit (INDEPENDENT_AMBULATORY_CARE_PROVIDER_SITE_OTHER): Payer: 59 | Admitting: Family Medicine

## 2018-02-27 ENCOUNTER — Encounter: Payer: Self-pay | Admitting: Family Medicine

## 2018-02-27 VITALS — BP 115/55 | HR 91 | Ht 64.0 in | Wt 168.8 lb

## 2018-02-27 DIAGNOSIS — N898 Other specified noninflammatory disorders of vagina: Secondary | ICD-10-CM

## 2018-02-27 DIAGNOSIS — L918 Other hypertrophic disorders of the skin: Secondary | ICD-10-CM | POA: Diagnosis not present

## 2018-02-27 NOTE — Progress Notes (Signed)
   Subjective:    Patient ID: Tonya Douglas, female    DOB: 1985/12/16, 33 y.o.   MRN: [id number]  HPI Patient is about 2 weeks postpartum and is seen due to piece of tissue hanging out of the bottom part of her vagina after having her baby. She did have a sulcal tear that was repaired. No other concerns.   Review of Systems     Objective:   Physical Exam Genitourinary:         Assessment & Plan:  1. Skin tag of vaginal mucosa Will wait to see how heals up in the next 2-3 weeks. Possible removal at that time.

## 2018-03-17 ENCOUNTER — Encounter: Payer: Self-pay | Admitting: Family Medicine

## 2018-03-17 ENCOUNTER — Ambulatory Visit (INDEPENDENT_AMBULATORY_CARE_PROVIDER_SITE_OTHER): Payer: 59 | Admitting: Family Medicine

## 2018-03-17 DIAGNOSIS — N898 Other specified noninflammatory disorders of vagina: Secondary | ICD-10-CM

## 2018-03-17 DIAGNOSIS — L918 Other hypertrophic disorders of the skin: Secondary | ICD-10-CM

## 2018-03-17 MED ORDER — NORETHINDRONE 0.35 MG PO TABS: 1.0000 | ORAL_TABLET | Freq: Every day | ORAL | 11 refills | Status: DC

## 2018-03-17 NOTE — Progress Notes (Signed)
Post Partum Exam  Tonya Douglas is a 33 y.o. G47P1001 female who presents for a postpartum visit. She is 5 week postpartum following a spontaneous vaginal delivery. I have fully reviewed the prenatal and intrapartum course. The delivery was at 40.1 gestational weeks.  Anesthesia: none. Postpartum course has been uneventful- patient has skin tag that is bothersome. Baby's course has been uneventful. Baby is feeding by breast. Bleeding thin lochia. Bowel function is normal. Bladder function is normal. Patient is not sexually active. Contraception method is oral progesterone-only contraceptive. Postpartum depression screening:neg   The following portions of the patient's history were reviewed and updated as appropriate: allergies, current medications, past family history, past medical history, past social history, past surgical history and problem list. Last pap smear done 07-29-17 and was Normal  Review of Systems Pertinent items are noted in HPI.    Objective:  unknown if currently breastfeeding.  General:  alert, cooperative and no distress  Lungs: clear to auscultation bilaterally  Heart:  regular rate and rhythm, S1, S2 normal, no murmur, click, rub or gallop  Abdomen: soft, non-tender; bowel sounds normal; no masses,  no organomegaly   Vulva:  50mm pedunculated labial tissue in the left lower side from labia minora. There is a 3cm area of submucosal tissue exposed just above that area.        Assessment:    normal postpartum exam. Pap smear not done at today's visit.   Plan:   1. Contraception: oral progesterone-only contraceptive 2. The submucosal tissue was cauterized with silver nitrate 3. Follow up in: 2 weeks or as needed.

## 2018-04-03 ENCOUNTER — Encounter: Payer: Self-pay | Admitting: Family Medicine

## 2018-04-03 ENCOUNTER — Ambulatory Visit (INDEPENDENT_AMBULATORY_CARE_PROVIDER_SITE_OTHER): Payer: 59 | Admitting: Family Medicine

## 2018-04-03 VITALS — BP 125/55 | HR 70 | Ht 64.0 in | Wt 163.0 lb

## 2018-04-03 DIAGNOSIS — N898 Other specified noninflammatory disorders of vagina: Secondary | ICD-10-CM

## 2018-04-03 DIAGNOSIS — L918 Other hypertrophic disorders of the skin: Secondary | ICD-10-CM

## 2018-04-03 DIAGNOSIS — S30814D Abrasion of vagina and vulva, subsequent encounter: Secondary | ICD-10-CM

## 2018-04-03 NOTE — Progress Notes (Signed)
   Subjective:    Patient ID: Tonya Douglas, female    DOB: October 09, 1985, 33 y.o.   MRN: [id number]  HPI Patient seen for follow-up of labial laceration during vaginal delivery.  At the last appointment, silver nitrate was used to induce granulation tissue.  Patient states that the pain has improved significantly but notices that there is still some mild redness to the area.  No foul-smelling discharge or pain.   Review of Systems     Objective:   Physical Exam Constitutional:      Appearance: Normal appearance.  Genitourinary:   Neurological:     Mental Status: She is alert.       Assessment & Plan:  1. Skin tag of vaginal mucosa Will evaluate in 6 months  2. Abrasion of vagina, subsequent encounter Appears to be healing.  Patient to call with any problems

## 2018-06-17 ENCOUNTER — Other Ambulatory Visit: Payer: Self-pay | Admitting: Family Medicine

## 2018-08-01 ENCOUNTER — Encounter: Payer: Self-pay | Admitting: Family Medicine

## 2018-08-01 ENCOUNTER — Ambulatory Visit (INDEPENDENT_AMBULATORY_CARE_PROVIDER_SITE_OTHER): Payer: 59 | Admitting: Family Medicine

## 2018-08-01 ENCOUNTER — Other Ambulatory Visit: Payer: Self-pay

## 2018-08-01 VITALS — BP 122/51 | HR 68 | Ht 64.0 in | Wt 156.0 lb

## 2018-08-01 DIAGNOSIS — N898 Other specified noninflammatory disorders of vagina: Secondary | ICD-10-CM

## 2018-08-01 DIAGNOSIS — L918 Other hypertrophic disorders of the skin: Secondary | ICD-10-CM | POA: Diagnosis not present

## 2018-08-01 MED ORDER — MUPIROCIN 2 % EX OINT: 1.0000 "application " | TOPICAL_OINTMENT | Freq: Four times a day (QID) | CUTANEOUS | 0 refills | Status: DC | PRN

## 2018-08-01 NOTE — Patient Instructions (Signed)
Keep area clean and dry.  Apply mupirocin (bactroban) 4 times a day as needed (or after using bathroom) Using a peribottle to rinse area after urinating may be helpful  Please contact me if having worsening pain or discomfort, redness to area, drainage.

## 2018-08-01 NOTE — Progress Notes (Signed)
Patient would like her

## 2018-08-01 NOTE — Progress Notes (Signed)
Patient having irritation of skin tag - rubbing against underwear, especially at work. 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  Patient has large 2cm skin tag of left lower labia minor as result of vaginal delivery. Wide base. The area was injected with lidocaine with epi 24mL after cleaning with betadine. The skin tag was removed at base with scalpel. The area was then repaired with 3-0 vicryl subcuticular stitch. Silver nitrate used to cauterize one area of persistent bleeding. Patient instructed to keep clean and dry. Use mupirocin to keep area from sticking and rubbing against clothing and pads.  F/u in 2-3 weeks.

## 2018-09-01 ENCOUNTER — Other Ambulatory Visit: Payer: Self-pay

## 2018-09-01 ENCOUNTER — Encounter: Payer: Self-pay | Admitting: Family Medicine

## 2018-09-01 ENCOUNTER — Ambulatory Visit (INDEPENDENT_AMBULATORY_CARE_PROVIDER_SITE_OTHER): Payer: 59 | Admitting: Family Medicine

## 2018-09-01 VITALS — BP 107/56 | HR 74 | Ht 64.0 in | Wt 156.0 lb

## 2018-09-01 DIAGNOSIS — Z01419 Encounter for gynecological examination (general) (routine) without abnormal findings: Secondary | ICD-10-CM

## 2018-09-01 NOTE — Progress Notes (Signed)
GYNECOLOGY ANNUAL PREVENTATIVE CARE ENCOUNTER NOTE  Subjective:   Tonya Douglas is a 33 y.o. 881P1001 female here for a routine annual gynecologic exam.  Current complaints: none.   Denies abnormal vaginal bleeding, discharge, pelvic pain, problems with intercourse or other gynecologic concerns.    The area of the labial skin tag that was removed 3 weeks ago is healing well, but has some mild discomfort and occasionally has spotting. From it if wipes too hard.  Gynecologic History Patient's last menstrual period was 08/05/2018. Patient is sexually active  Contraception: oral progesterone-only contraceptive Last Pap: 2019. Results were: normal Last mammogram: n/a.  Obstetric History OB History  Gravida Para Term Preterm AB Living  1 1 1  0 0 1  SAB TAB Ectopic Multiple Live Births  0 0 0 0 1    # Outcome Date GA Lbr Len/2nd Weight Sex Delivery Anes PTL Lv  1 Term 02/11/18 8142w1d 01:12 / 00:28 7 lb 4.8 oz (3.31 kg) M Vag-Spont None  LIV    Past Medical History:  Diagnosis Date  . 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 Abnormal Pap smear 02/2010   LSIL, COLPO Neg  . STD (sexually transmitted disease) 06/2009   + chlam   . STD (sexually transmitted disease) 03/29/11   + HSV II proven C&S vulva  . 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 COLPOSCOPY  07/2009   CIN I    Current Outpatient Medications on File Prior to Visit  Medication Sig Dispense Refill  . ibuprofen (ADVIL,MOTRIN) 600 MG tablet Take 1 tablet (600 mg total) by mouth every 6 (six) hours. (Patient not taking: Reported on 09/01/2018) 30 tablet 0  . mupirocin ointment (BACTROBAN) 2 % Apply 1 application topically 4 (four) times daily as needed. (Patient not taking: Reported on 09/01/2018) 22 g 0  . norethindrone (MICRONOR) 0.35 MG tablet TAKE 1 TABLET BY MOUTH EVERY  DAY (Patient not taking: Reported on 09/01/2018) 84 tablet 4  . Prenatal Multivit-Min-Fe-FA (PRE-NATAL FORMULA PO) Take by mouth.    . Probiotic Product (PROBIOTIC-10) CAPS Take by mouth.    . senna-docusate (SENOKOT-S) 8.6-50 MG tablet Take 2 tablets by mouth daily. (Patient not taking: Reported on 09/01/2018) 60 tablet 0   No current facility-administered medications on file prior to visit.     No Known Allergies  Social History   Socioeconomic History  . Marital status: Single    Spouse name: Not on file  . Number of children: Not on file  . Years of education: Not on file  . Highest education level: Not on file  Occupational History  . Not on file  Social Needs  . Financial resource strain: Not hard at all  . Food insecurity    Worry: Never true    Inability: Never true  . Transportation needs    Medical: No    Non-medical: Not on file  Tobacco Use  . Smoking status: Never Smoker  . Smokeless tobacco: Never Used  Substance and Sexual Activity  . Alcohol use: Yes    Alcohol/week: 2.0 standard drinks    Types: 2 Standard drinks or equivalent per week  . Drug use: No  . Sexual activity: Yes    Partners: Male    Birth control/protection: Pill  Lifestyle  . Physical activity  Days per week: Not on file    Minutes per session: Not on file  . Stress: Not at all  Relationships  . Social Herbalist on phone: Not on file    Gets together: Not on file    Attends religious service: Not on file    Active member of club or organization: Not on file    Attends meetings of clubs or organizations: Not on file    Relationship status: Not on file  . Intimate partner violence    Fear of current or ex partner: No    Emotionally abused: No    Physically abused: No    Forced sexual activity: No  Other Topics Concern  . Not on file  Social History Narrative  . Not on file    Family History  Problem Relation Age of Onset  . Hypertension Father     The  following portions of the patient's history were reviewed and updated as appropriate: allergies, current medications, past family history, past medical history, past social history, past surgical history and problem list.  Review of Systems Pertinent items are noted in HPI.   Objective:  BP (!) 107/56   Pulse 74   Ht 5\' 4"  (1.626 m)   Wt 156 lb (70.8 kg)   LMP 08/05/2018   BMI 26.78 kg/m  Wt Readings from Last 3 Encounters:  09/01/18 156 lb (70.8 kg)  08/01/18 156 lb (70.8 kg)  04/03/18 163 lb (73.9 kg)     CONSTITUTIONAL: Well-developed, well-nourished female in no acute distress.  HENT:  Normocephalic, atraumatic, External right and left ear normal. Oropharynx is clear and moist EYES: Conjunctivae and EOM are normal. Pupils are equal, round, and reactive to light. No scleral icterus.  NECK: Normal range of motion, supple, no masses.  Normal thyroid.   CARDIOVASCULAR: Normal heart rate noted, regular rhythm RESPIRATORY: Clear to auscultation bilaterally. Effort and breath sounds normal, no problems with respiration noted. BREASTS: deferred for now as patient breast feeding. ABDOMEN: Soft, normal bowel sounds, no distention noted.  No tenderness, rebound or guarding.  PELVIC: Area from skin tag healing well. There is a small area of submucosal tissue exposed. Normal appearing external genitalia; normal appearing vaginal mucosa and cervix.  No abnormal discharge noted.  Normal uterine size, no other palpable masses, no uterine or adnexal tenderness. MUSCULOSKELETAL: Normal range of motion. No tenderness.  No cyanosis, clubbing, or edema.  2+ distal pulses. SKIN: Skin is warm and dry. No rash noted. Not diaphoretic. No erythema. No pallor. NEUROLOGIC: Alert and oriented to person, place, and time. Normal reflexes, muscle tone coordination. No cranial nerve deficit noted. PSYCHIATRIC: Normal mood and affect. Normal behavior. Normal judgment and thought content.  Assessment:  Annual  gynecologic examination with pap smear   Plan:  1. Well Woman Exam Will follow up results of pap smear and manage accordingly. Silver nitrate applied. Follow up if not healing in next 10-14 days.   Routine preventative health maintenance measures emphasized. Please refer to After Visit Summary for other counseling recommendations.    Loma Boston, [place] for [employer]

## 2018-11-11 ENCOUNTER — Other Ambulatory Visit (INDEPENDENT_AMBULATORY_CARE_PROVIDER_SITE_OTHER): Payer: 59

## 2018-11-11 ENCOUNTER — Other Ambulatory Visit: Payer: Self-pay

## 2018-11-11 VITALS — BP 108/59 | HR 88

## 2018-11-11 DIAGNOSIS — N898 Other specified noninflammatory disorders of vagina: Secondary | ICD-10-CM

## 2018-11-11 DIAGNOSIS — Z113 Encounter for screening for infections with a predominantly sexual mode of transmission: Secondary | ICD-10-CM

## 2018-11-11 DIAGNOSIS — B373 Candidiasis of vulva and vagina: Secondary | ICD-10-CM | POA: Diagnosis not present

## 2018-11-11 NOTE — Progress Notes (Signed)
Chart reviewed - agree with RN documentation.   

## 2018-11-11 NOTE — Progress Notes (Signed)
Patient thinks she has a yeast infection. Patient states that she has used one day of monistat with very little relief. Patient is having a thick white discharge with itching. Patient would like to be swabbed for STD check. Patient has not had any known exposure.  Patient performed self swabbed and will culture and let her know results. Kathrene Alu RN

## 2018-11-18 LAB — CERVICOVAGINAL ANCILLARY ONLY
Bacterial Vaginitis (gardnerella): NEGATIVE
Candida Glabrata: NEGATIVE
Candida Vaginitis: POSITIVE — AB
Chlamydia: NEGATIVE
Comment: NEGATIVE
Comment: NEGATIVE
Comment: NEGATIVE
Comment: NEGATIVE
Neisseria Gonorrhea: NEGATIVE
Trichomonas: NEGATIVE

## 2018-11-18 MED ORDER — FLUCONAZOLE 150 MG PO TABS: 150.0000 mg | ORAL_TABLET | Freq: Once | ORAL | 3 refills | Status: AC

## 2018-11-18 NOTE — Addendum Note (Signed)
Addended by: Truett Mainland on: 11/18/2018 12:42 PM   Modules accepted: Orders

## 2019-04-20 ENCOUNTER — Encounter: Payer: Self-pay | Admitting: Certified Nurse Midwife

## 2019-04-22 ENCOUNTER — Encounter: Payer: Self-pay | Admitting: Certified Nurse Midwife

## 2019-08-13 ENCOUNTER — Encounter: Payer: Self-pay | Admitting: Family Medicine

## 2019-08-13 ENCOUNTER — Ambulatory Visit (INDEPENDENT_AMBULATORY_CARE_PROVIDER_SITE_OTHER): Payer: 59 | Admitting: Family Medicine

## 2019-08-13 ENCOUNTER — Other Ambulatory Visit: Payer: Self-pay

## 2019-08-13 ENCOUNTER — Other Ambulatory Visit (HOSPITAL_COMMUNITY)
Admission: RE | Admit: 2019-08-13 | Discharge: 2019-08-13 | Disposition: A | Discharge status: Home / Self Care | Payer: 59 | Source: Ambulatory Visit | Attending: Family Medicine | Admitting: Family Medicine

## 2019-08-13 VITALS — BP 111/57 | HR 79 | Wt 171.4 lb

## 2019-08-13 DIAGNOSIS — Z01419 Encounter for gynecological examination (general) (routine) without abnormal findings: Secondary | ICD-10-CM | POA: Diagnosis not present

## 2019-08-13 DIAGNOSIS — Z0000 Encounter for general adult medical examination without abnormal findings: Secondary | ICD-10-CM | POA: Insufficient documentation

## 2019-08-13 NOTE — Progress Notes (Signed)
Last pap 2019-normal  STI testing today No birth control

## 2019-08-13 NOTE — Progress Notes (Signed)
GYNECOLOGY ANNUAL PREVENTATIVE CARE ENCOUNTER NOTE  Subjective:   Tonya Douglas is a 34 y.o. G8P1001 female here for a routine annual gynecologic exam.  Current complaints: none.   Denies abnormal vaginal bleeding, discharge, pelvic pain, problems with intercourse or other gynecologic concerns.    Gynecologic History Patient's last menstrual period was 07/19/2019. Patient is sexually active  Contraception: condoms Last Pap: 2019. Results were: normal Last mammogram: n/a.  Obstetric History OB History  Gravida Para Term Preterm AB Living  1 1 1  0 0 1  SAB TAB Ectopic Multiple Live Births  0 0 0 0 1    # Outcome Date GA Lbr Len/2nd Weight Sex Delivery Anes PTL Lv  1 Term 02/11/18 [redacted]w[redacted]d 01:12 / 00:28 7 lb 4.8 oz (3.31 kg) M Vag-Spont None  LIV    Past Medical History:  Diagnosis Date  . 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 Abnormal Pap smear 02/2010   LSIL, COLPO Neg  . STD (sexually transmitted disease) 06/2009   + chlam   . STD (sexually transmitted disease) 03/29/11   + HSV II proven C&S vulva  . STD (sexually transmitted disease) 04/11/11   + RPR titer, confirmation test neg do T.pallidium test (lab 06/11/11)    Past Surgical History:  Procedure Laterality Date  . COLPOSCOPY  07/2009   CIN I    Current Outpatient Medications on File Prior to Visit  Medication Sig Dispense Refill  . ibuprofen (ADVIL,MOTRIN) 600 MG tablet Take 1 tablet (600 mg total) by mouth every 6 (six) hours. (Patient not taking: Reported on 09/01/2018) 30 tablet 0  . mupirocin ointment (BACTROBAN) 2 % Apply 1 application topically 4 (four) times daily as needed. (Patient not taking: Reported on 09/01/2018) 22 g 0  . norethindrone (MICRONOR) 0.35 MG tablet TAKE 1 TABLET BY MOUTH EVERY DAY (Patient not taking: Reported on 09/01/2018) 84 tablet 4  . Prenatal Multivit-Min-Fe-FA (PRE-NATAL FORMULA PO) Take by mouth. (Patient not taking: Reported on 08/13/2019)    . Probiotic Product  (PROBIOTIC-10) CAPS Take by mouth.    . senna-docusate (SENOKOT-S) 8.6-50 MG tablet Take 2 tablets by mouth daily. (Patient not taking: Reported on 09/01/2018) 60 tablet 0   No current facility-administered medications on file prior to visit.    No Known Allergies  Social History   Socioeconomic History  . Marital status: Single    Spouse name: Not on file  . Number of children: Not on file  . Years of education: Not on file  . Highest education level: Not on file  Occupational History  . Not on file  Tobacco Use  . Smoking status: Never Smoker  . Smokeless tobacco: Never Used  Vaping Use  . Vaping Use: Never used  Substance and Sexual Activity  . Alcohol use: Yes    Alcohol/week: 2.0 standard drinks    Types: 2 Standard drinks or equivalent per week  . Drug use: No  . Sexual activity: Yes    Partners: Male    Birth control/protection: Pill  Other Topics Concern  . Not on file  Social History Narrative  . Not on file   Social Determinants of Health   Financial Resource Strain:   . Difficulty of Paying Living Expenses:   Food Insecurity:   . Worried About 09/03/2018 in the Last Year:   . Programme researcher, broadcasting/film/video in the Last Year:   Transportation Needs:   .  Lack of Transportation (Medical):   Marland Kitchen Lack of Transportation (Non-Medical):   Physical Activity:   . Days of Exercise per Week:   . Minutes of Exercise per Session:   Stress:   . Feeling of Stress :   Social Connections:   . Frequency of Communication with Friends and Family:   . Frequency of Social Gatherings with Friends and Family:   . Attends Religious Services:   . Active Member of Clubs or Organizations:   . Attends Banker Meetings:   Marland Kitchen Marital Status:   Intimate Partner Violence:   . Fear of Current or Ex-Partner:   . Emotionally Abused:   Marland Kitchen Physically Abused:   . Sexually Abused:     Family History  Problem Relation Age of Onset  . Hypertension Father     The following  portions of the patient's history were reviewed and updated as appropriate: allergies, current medications, past family history, past medical history, past social history, past surgical history and problem list.  Review of Systems Pertinent items are noted in HPI.   Objective:  BP (!) 111/57   Pulse 79   Wt 171 lb 6.4 oz (77.7 kg)   LMP 07/19/2019   BMI 29.42 kg/m  Wt Readings from Last 3 Encounters:  08/13/19 171 lb 6.4 oz (77.7 kg)  09/01/18 156 lb (70.8 kg)  08/01/18 156 lb (70.8 kg)     Chaperone present during exam  CONSTITUTIONAL: Well-developed, well-nourished female in no acute distress.  HENT:  Normocephalic, atraumatic, External right and left ear normal. Oropharynx is clear and moist EYES: Conjunctivae and EOM are normal. Pupils are equal, round, and reactive to light. No scleral icterus.  NECK: Normal range of motion, supple, no masses.  Normal thyroid.   CARDIOVASCULAR: Normal heart rate noted, regular rhythm RESPIRATORY: Clear to auscultation bilaterally. Effort and breath sounds normal, no problems with respiration noted. BREASTS: Symmetric in size. No masses, skin changes, nipple drainage, or lymphadenopathy. ABDOMEN: Soft, normal bowel sounds, no distention noted.  No tenderness, rebound or guarding.  PELVIC: Normal appearing external genitalia; normal appearing vaginal mucosa and cervix.  No abnormal discharge noted.  Normal uterine size, no other palpable masses, no uterine or adnexal tenderness. MUSCULOSKELETAL: Normal range of motion. No tenderness.  No cyanosis, clubbing, or edema.  2+ distal pulses. SKIN: Skin is warm and dry. No rash noted. Not diaphoretic. No erythema. No pallor. NEUROLOGIC: Alert and oriented to person, place, and time. Normal reflexes, muscle tone coordination. No cranial nerve deficit noted. PSYCHIATRIC: Normal mood and affect. Normal behavior. Normal judgment and thought content.  Assessment:  Annual gynecologic examination with pap  smear   Plan:  1. Well Woman Exam Will follow up results of pap smear and manage accordingly. STD testing discussed. Patient requested testing - Cytology - PAP( [place]) - Cervicovaginal ancillary only( Tonya Douglas)   Routine preventative health maintenance measures emphasized. Please refer to After Visit Summary for other counseling recommendations.    Candelaria Celeste, DO [facility]

## 2019-08-17 LAB — CERVICOVAGINAL ANCILLARY ONLY
Bacterial Vaginitis (gardnerella): NEGATIVE
Candida Glabrata: NEGATIVE
Candida Vaginitis: NEGATIVE
Chlamydia: NEGATIVE
Comment: NEGATIVE
Comment: NEGATIVE
Comment: NEGATIVE
Comment: NEGATIVE
Comment: NEGATIVE
Comment: NORMAL
Neisseria Gonorrhea: NEGATIVE
Trichomonas: NEGATIVE

## 2019-08-18 LAB — CYTOLOGY - PAP
Comment: NEGATIVE
Diagnosis: NEGATIVE
High risk HPV: NEGATIVE

## 2020-03-08 ENCOUNTER — Other Ambulatory Visit: Payer: Self-pay

## 2020-03-08 MED ORDER — VALACYCLOVIR HCL 500 MG PO TABS: 500.0000 mg | ORAL_TABLET | Freq: Two times a day (BID) | ORAL | 5 refills | Status: DC

## 2020-06-08 ENCOUNTER — Encounter: Payer: 59 | Admitting: Family Medicine

## 2020-07-20 ENCOUNTER — Other Ambulatory Visit: Payer: Self-pay

## 2020-07-20 ENCOUNTER — Encounter: Payer: Self-pay | Admitting: Family Medicine

## 2020-07-20 ENCOUNTER — Ambulatory Visit (INDEPENDENT_AMBULATORY_CARE_PROVIDER_SITE_OTHER): Payer: 59 | Admitting: Family Medicine

## 2020-07-20 ENCOUNTER — Other Ambulatory Visit (HOSPITAL_COMMUNITY)
Admission: RE | Admit: 2020-07-20 | Discharge: 2020-07-20 | Disposition: A | Discharge status: Home / Self Care | Payer: 59 | Source: Ambulatory Visit | Attending: Family Medicine | Admitting: Family Medicine

## 2020-07-20 VITALS — BP 124/64 | HR 81 | Wt 180.0 lb

## 2020-07-20 DIAGNOSIS — Z8632 Personal history of gestational diabetes: Secondary | ICD-10-CM | POA: Insufficient documentation

## 2020-07-20 DIAGNOSIS — O0990 Supervision of high risk pregnancy, unspecified, unspecified trimester: Secondary | ICD-10-CM | POA: Diagnosis present

## 2020-07-20 LAB — OB RESULTS CONSOLE GC/CHLAMYDIA: Gonorrhea: NEGATIVE

## 2020-07-20 NOTE — Progress Notes (Signed)
Pt presents today for initial ob visit. PHQ 9: 1, GAD 7: 2. No complaints.

## 2020-07-20 NOTE — Addendum Note (Signed)
Addended by: Lorelle Gibbs L on: 07/20/2020 02:11 PM   Modules accepted: Orders

## 2020-07-20 NOTE — Progress Notes (Signed)
Subjective:  Tonya Douglas is a G2P1001 [redacted]w[redacted]d by LMP and c/w [place] today, being seen today for her first obstetrical visit.  Her obstetrical history is significant for  GDM in previous pregnancy. First pregnancy resulted in SVD. Has h/o HSV . Patient does intend to breast feed. Pregnancy history fully reviewed. Planned pregnancy  Patient reports no complaints.  BP 124/64   Pulse 81   Wt 180 lb (81.6 kg)   LMP 05/08/2020   BMI 30.90 kg/m   HISTORY: OB History  Gravida Para Term Preterm AB Living  2 1 1  0 0 1  SAB IAB Ectopic Multiple Live Births  0 0 0 0 1    # Outcome Date GA Lbr Len/2nd Weight Sex Delivery Anes PTL Lv  2 Current           1 Term 02/11/18 [redacted]w[redacted]d 01:12 / 00:28 7 lb 4.8 oz (3.31 kg) M Vag-Spont None  LIV    Past Medical History:  Diagnosis Date   Abnormal Pap smear 07/04/09   Asc-us + HPV, COLPO 07/2009 CIN 1   Abnormal Pap smear 02/2010   LSIL, COLPO Neg   STD (sexually transmitted disease) 06/2009   + chlam    STD (sexually transmitted disease) 03/29/11   + HSV II proven C&S vulva   STD (sexually transmitted disease) 04/11/11   + RPR titer, confirmation test neg do T.pallidium test (lab 06/11/11)    Past Surgical History:  Procedure Laterality Date   COLPOSCOPY  07/2009   CIN I    Family History  Problem Relation Age of Onset   Hypertension Father      Exam  BP 124/64   Pulse 81   Wt 180 lb (81.6 kg)   LMP 05/08/2020   BMI 30.90 kg/m   Chaperone present during exam  CONSTITUTIONAL: Well-developed, well-nourished female in no acute distress.  HENT:  Normocephalic, atraumatic, External right and left ear normal. Oropharynx is clear and moist EYES: Conjunctivae and EOM are normal. Pupils are equal, round, and reactive to light. No scleral icterus.  NECK: Normal range of motion, supple, no masses.  Normal thyroid.  CARDIOVASCULAR: Normal heart rate noted, regular rhythm RESPIRATORY: Clear to auscultation bilaterally. Effort and breath sounds normal,  no problems with respiration noted. BREASTS: Symmetric in size. No masses, skin changes, nipple drainage, or lymphadenopathy. ABDOMEN: Soft, normal bowel sounds, no distention noted.  No tenderness, rebound or guarding.  PELVIC: Normal appearing external genitalia; normal appearing vaginal mucosa and cervix. No abnormal discharge noted.  MUSCULOSKELETAL: Normal range of motion. No tenderness.  No cyanosis, clubbing, or edema.  2+ distal pulses. SKIN: Skin is warm and dry. No rash noted. Not diaphoretic. No erythema. No pallor. NEUROLOGIC: Alert and oriented to person, place, and time. Normal reflexes, muscle tone coordination. No cranial nerve deficit noted. PSYCHIATRIC: Normal mood and affect. Normal behavior. Normal judgment and thought content.    Assessment:    Pregnancy: G2P1001 Patient Active Problem List   Diagnosis Date Noted   Supervision of high risk pregnancy, antepartum 07/20/2020      Plan:   1. Supervision of high risk pregnancy, antepartum FHT and FH normal CRL today consistent with dates. - Cervicovaginal ancillary only( ) - Culture, OB Urine - CH[facility] AMB BABYSCRIPTS OPT IN - Genetic Screening - Hemoglobin A1c - Protein / creatinine ratio, urine - TSH - Comprehensive metabolic panel - CBC/D/Plt+RPR+Rh+ABO+RubIgG... - Hemoglobpathy+Fer w/A Thal Rfx  2. History of gestational diabetes mellitus (GDM) Check HgA1c - Cervicovaginal  ancillary only( [place]) - Culture, OB Urine - CHL AMB BABYSCRIPTS OPT IN - Genetic Screening - Hemoglobin A1c - Protein / creatinine ratio, urine - TSH - Comprehensive metabolic panel - CBC/D/Plt+RPR+Rh+ABO+RubIgG... - Hemoglobpathy+Fer w/A Thal Rfx    Initial labs obtained Continue prenatal vitamins Reviewed n/v relief measures and warning s/s to report Reviewed recommended weight gain based on pre-gravid BMI Encouraged well-balanced diet Genetic & carrier screening discussed: requests Panorama,  Ultrasound  discussed; fetal survey: requested CCNC completed> form faxed if has or is planning to apply for medicaid The nature of [facility] [employer] with multiple MDs and other Advanced Practice Providers was explained to patient; also emphasized that fellows, residents, and students are part of our team. home bp cuff. Check bp weekly, let us know if >140/90.     Problem list reviewed and updated. 75% of 30 min visit spent on counseling and coordination of care.     Levie Heritage 07/20/2020

## 2020-07-20 NOTE — Addendum Note (Signed)
Addended by: Lorelle Gibbs L on: 07/20/2020 02:08 PM   Modules accepted: Orders

## 2020-07-21 LAB — PROTEIN / CREATININE RATIO, URINE
Creatinine, Urine: 122.7 mg/dL
Protein, Ur: 7.2 mg/dL
Protein/Creat Ratio: 59 mg/g creat (ref 0–200)

## 2020-07-21 LAB — CERVICOVAGINAL ANCILLARY ONLY
Chlamydia: NEGATIVE
Comment: NEGATIVE
Comment: NEGATIVE
Comment: NORMAL
Neisseria Gonorrhea: NEGATIVE
Trichomonas: NEGATIVE

## 2020-07-22 LAB — CBC/D/PLT+RPR+RH+ABO+RUBIGG...
Antibody Screen: NEGATIVE
Basophils Absolute: 0.1 10*3/uL (ref 0.0–0.2)
Basos: 1 %
EOS (ABSOLUTE): 0.2 10*3/uL (ref 0.0–0.4)
Eos: 2 %
HCV Ab: <0.1 s/co ratio (ref 0.0–0.9)
HIV Screen 4th Generation wRfx: NONREACTIVE
Hematocrit: 40.9 % (ref 34.0–46.6)
Hemoglobin: 12.9 g/dL (ref 11.1–15.9)
Hepatitis B Surface Ag: NEGATIVE
Immature Grans (Abs): 0.1 10*3/uL (ref 0.0–0.1)
Immature Granulocytes: 1 %
Lymphocytes Absolute: 2.4 10*3/uL (ref 0.7–3.1)
Lymphs: 23 %
MCH: 29.1 pg (ref 26.6–33.0)
MCHC: 31.5 g/dL (ref 31.5–35.7)
MCV: 92 fL (ref 79–97)
Monocytes Absolute: 0.8 10*3/uL (ref 0.1–0.9)
Monocytes: 7 %
Neutrophils Absolute: 7.2 10*3/uL — ABNORMAL HIGH (ref 1.4–7.0)
Neutrophils: 66 %
Platelets: 293 10*3/uL (ref 150–450)
RBC: 4.43 x10E6/uL (ref 3.77–5.28)
RDW: 13.7 % (ref 11.7–15.4)
RPR Ser Ql: NONREACTIVE
Rh Factor: POSITIVE
Rubella Antibodies, IGG: <0.9 index — ABNORMAL LOW (ref >0.99)
WBC: 10.7 10*3/uL (ref 3.4–10.8)

## 2020-07-22 LAB — CULTURE, OB URINE

## 2020-07-22 LAB — COMPREHENSIVE METABOLIC PANEL
ALT: 12 IU/L (ref 0–32)
AST: 15 IU/L (ref 0–40)
Albumin/Globulin Ratio: 1.6 (ref 1.2–2.2)
Albumin: 4.2 g/dL (ref 3.8–4.8)
Alkaline Phosphatase: 56 IU/L (ref 44–121)
BUN/Creatinine Ratio: 17 (ref 9–23)
BUN: 10 mg/dL (ref 6–20)
Bilirubin Total: <0.2 mg/dL (ref 0.0–1.2)
CO2: 17 mmol/L — ABNORMAL LOW (ref 20–29)
Calcium: 9.2 mg/dL (ref 8.7–10.2)
Chloride: 105 mmol/L (ref 96–106)
Creatinine, Ser: 0.6 mg/dL (ref 0.57–1.00)
Globulin, Total: 2.7 g/dL (ref 1.5–4.5)
Glucose: 88 mg/dL (ref 65–99)
Potassium: 3.9 mmol/L (ref 3.5–5.2)
Sodium: 139 mmol/L (ref 134–144)
Total Protein: 6.9 g/dL (ref 6.0–8.5)
eGFR: 122 mL/min/{1.73_m2} (ref >59)

## 2020-07-22 LAB — HEMOGLOBPATHY+FER W/A THAL RFX
Ferritin: 43 ng/mL (ref 15–150)
Hgb A2: 2.3 % (ref 1.8–3.2)
Hgb A: 97.7 % (ref 96.4–98.8)
Hgb F: 0 % (ref 0.0–2.0)
Hgb S: 0 %

## 2020-07-22 LAB — HCV INTERPRETATION

## 2020-07-22 LAB — HEMOGLOBIN A1C
Est. average glucose Bld gHb Est-mCnc: 108 mg/dL
Hgb A1c MFr Bld: 5.4 % (ref 4.8–5.6)

## 2020-07-22 LAB — URINE CULTURE, OB REFLEX

## 2020-07-22 LAB — TSH: TSH: 0.661 u[IU]/mL (ref 0.450–4.500)

## 2020-07-31 ENCOUNTER — Encounter (HOSPITAL_BASED_OUTPATIENT_CLINIC_OR_DEPARTMENT_OTHER): Payer: Self-pay | Admitting: *Deleted

## 2020-07-31 ENCOUNTER — Other Ambulatory Visit: Payer: Self-pay

## 2020-07-31 ENCOUNTER — Emergency Department (HOSPITAL_BASED_OUTPATIENT_CLINIC_OR_DEPARTMENT_OTHER): Payer: 59

## 2020-07-31 ENCOUNTER — Emergency Department (HOSPITAL_BASED_OUTPATIENT_CLINIC_OR_DEPARTMENT_OTHER)
Admission: EM | Admit: 2020-07-31 | Discharge: 2020-07-31 | Disposition: A | Discharge status: Home / Self Care | Payer: 59 | Attending: Emergency Medicine | Admitting: Emergency Medicine

## 2020-07-31 DIAGNOSIS — S43005A Unspecified dislocation of left shoulder joint, initial encounter: Secondary | ICD-10-CM | POA: Insufficient documentation

## 2020-07-31 DIAGNOSIS — X58XXXA Exposure to other specified factors, initial encounter: Secondary | ICD-10-CM | POA: Insufficient documentation

## 2020-07-31 DIAGNOSIS — S4992XA Unspecified injury of left shoulder and upper arm, initial encounter: Secondary | ICD-10-CM | POA: Diagnosis present

## 2020-07-31 MED ORDER — ONDANSETRON HCL 4 MG/2ML IJ SOLN
4.0000 mg | Freq: Once | INTRAMUSCULAR | Status: AC
  Administered 2020-07-31: 4 mg via INTRAVENOUS
  Filled 2020-07-31: qty 2

## 2020-07-31 MED ORDER — FENTANYL CITRATE (PF) 100 MCG/2ML IJ SOLN
100.0000 ug | Freq: Once | INTRAMUSCULAR | Status: AC
  Administered 2020-07-31: 100 ug via INTRAVENOUS
  Filled 2020-07-31: qty 2

## 2020-07-31 MED ORDER — LIDOCAINE-EPINEPHRINE (PF) 2 %-1:200000 IJ SOLN
10.0000 mL | Freq: Once | INTRAMUSCULAR | Status: AC
  Administered 2020-07-31: 10 mL via INTRADERMAL
  Filled 2020-07-31: qty 20

## 2020-07-31 NOTE — ED Notes (Signed)
ED Provider at bedside. Dr. Floyd ?

## 2020-07-31 NOTE — ED Provider Notes (Signed)
[facility] EMERGENCY DEPARTMENT Provider Note   CSN: [id number] Arrival date & time: 07/31/20  1608     History Chief Complaint  Patient presents with   Shoulder Injury    Tonya Douglas is a 35 y.o. female.  35 yo F with a cc of L shoulder pain.  Was swatting at a fly and felt it dislocate.  Has hx of the same.  Happened about 45 min ago.  Pain with movement, palpation.  Deformity.  Denies numbness, denies trauma.   The history is provided by the patient and a parent.  Shoulder Injury This is a recurrent problem. The current episode started less than 1 hour ago. The problem occurs constantly. The problem has not changed since onset.Pertinent negatives include no chest pain, no headaches and no shortness of breath. The symptoms are aggravated by bending and twisting. Nothing relieves the symptoms. She has tried nothing for the symptoms. The treatment provided no relief.  With a chief complaints of left shoulder pain     Past Medical History:  Diagnosis Date   Abnormal Pap smear 07/04/09   Asc-us + HPV, COLPO 07/2009 CIN 1   Abnormal Pap smear 02/2010   LSIL, COLPO Neg   STD (sexually transmitted disease) 06/2009   + chlam    STD (sexually transmitted disease) 03/29/11   + HSV II proven C&S vulva   STD (sexually transmitted disease) 04/11/11   + RPR titer, confirmation test neg do T.pallidium test (lab 21308)    Patient Active Problem List   Diagnosis Date Noted   Supervision of high risk pregnancy, antepartum 07/20/2020   History of gestational diabetes mellitus (GDM) 07/20/2020    Past Surgical History:  Procedure Laterality Date   COLPOSCOPY  07/2009   CIN I     OB History     Gravida  2   Para  1   Term  1   Preterm  0   AB  0   Living  1      SAB  0   IAB  0   Ectopic  0   Multiple  0   Live Births  1           Family History  Problem Relation Age of Onset   Hypertension Father     Social History   Tobacco Use   Smoking  status: Never   Smokeless tobacco: Never  Vaping Use   Vaping Use: Never used  Substance Use Topics   Alcohol use: Yes    Alcohol/week: 2.0 standard drinks    Types: 2 Standard drinks or equivalent per week   Drug use: No    Home Medications Prior to Admission medications   Medication Sig Start Date End Date Taking? Authorizing Provider  Omega-3 1000 MG CAPS Take by mouth.    [provider]  Prenatal Multivit-Min-Fe-FA (PRE-NATAL FORMULA PO) Take by mouth.    [provider]  Probiotic Product (PROBIOTIC-10) CAPS Take by mouth.    [provider]  valACYclovir (VALTREX) 500 MG tablet Take 1 tablet (500 mg total) by mouth 2 (two) times daily. 03/08/20   Levie Heritage, DO    Allergies    Patient has no known allergies.  Review of Systems   Review of Systems  Constitutional:  Negative for chills and fever.  HENT:  Negative for congestion and rhinorrhea.   Eyes:  Negative for redness and visual disturbance.  Respiratory:  Negative for shortness of breath and  wheezing.   Cardiovascular:  Negative for chest pain and palpitations.  Gastrointestinal:  Negative for nausea and vomiting.  Genitourinary:  Negative for dysuria and urgency.  Musculoskeletal:  Positive for arthralgias. Negative for myalgias.  Skin:  Negative for pallor and wound.  Neurological:  Negative for dizziness and headaches.   Physical Exam Updated Vital Signs BP 112/84 (BP Location: Right Arm)   Pulse (!) 101   Temp 98.6 F (37 C) (Oral)   Resp 16   Ht 5\' 4"  (1.626 m)   Wt 81.6 kg   LMP 05/08/2020   SpO2 99%   BMI 30.90 kg/m   Physical Exam Vitals and nursing note reviewed.  Constitutional:      General: She is not in acute distress.    Appearance: She is well-developed. She is not diaphoretic.  HENT:     Head: Normocephalic and atraumatic.  Eyes:     Pupils: Pupils are equal, round, and reactive to light.  Cardiovascular:     Rate and Rhythm: Normal rate and regular  rhythm.     Heart sounds: No murmur heard.   No friction rub. No gallop.  Pulmonary:     Effort: Pulmonary effort is normal.     Breath sounds: No wheezing or rales.  Abdominal:     General: There is no distension.     Palpations: Abdomen is soft.     Tenderness: There is no abdominal tenderness.  Musculoskeletal:        General: Tenderness present.     Cervical back: Normal range of motion and neck supple.  Skin:    General: Skin is warm and dry.  Neurological:     Mental Status: She is alert and oriented to person, place, and time.  Psychiatric:        Behavior: Behavior normal.    ED Results / Procedures / Treatments   Labs (all labs ordered are listed, but only abnormal results are displayed) Labs Reviewed - No data to display  EKG None  Radiology DG Shoulder Left Portable  Result Date: 07/31/2020 CLINICAL DATA:  Post reduction EXAM: LEFT SHOULDER COMPARISON:  Radiographs 03/05/2016, 07/31/2020 FINDINGS: Displacement of the humeral head is significantly diminished from prior albeit with some persistent inferior subluxation on frontal image which may be projectional given a more normal appearance on the scapular Y-view. No clear glenoid injury is seen. Some chronic fragmentation about the acromion is stable from priors. IMPRESSION: Suspect successful relocation of the left humerus with some persistent inferior translation of the humeral head on frontal radiograph favored to be projectional given appearance on scapular Y-view. Alternatively, some slight inferior translation could be the result of incomplete relocation or joint effusion. Could consider repeat imaging with 08/02/2020 view if there is persisting clinical concern. Electronically Signed   By: Stevenson Clinch M.D.   On: 07/31/2020 18:09   DG Shoulder Left Portable  Result Date: 07/31/2020 CLINICAL DATA:  Left shoulder deformity. History of shoulder dislocation. EXAM: LEFT SHOULDER COMPARISON:  03/05/2016 FINDINGS: The  humeral head is dislocated anteriorly and inferiorly relative to the glenoid. No acute fracture is identified. The acromioclavicular joint is unremarkable. The soft tissues are unremarkable. IMPRESSION: Anterior dislocation of the left humeral head. Electronically Signed   By: 03/07/2016 M.D.   On: 07/31/2020 17:29    Procedures Reduction of dislocation  Date/Time: 07/31/2020 5:55 PM Performed by: 08/02/2020, DO Authorized by: Melene Plan, DO  Consent: Verbal consent obtained. Risks and benefits: risks, benefits and alternatives  were discussed Consent given by: patient Patient understanding: patient states understanding of the procedure being performed Patient consent: the patient's understanding of the procedure matches consent given Relevant documents: relevant documents present and verified Site marked: the operative site was marked Imaging studies: imaging studies available Patient identity confirmed: verbally with patient Time out: Immediately prior to procedure a "time out" was called to verify the correct patient, procedure, equipment, support staff and site/side marked as required. Preparation: Patient was prepped and draped in the usual sterile fashion. Local anesthesia used: yes Anesthesia: local infiltration  Anesthesia: Local anesthesia used: yes Local Anesthetic: lidocaine 2% with epinephrine Anesthetic total: 20 mL  Sedation: Patient sedated: no  Patient tolerance: patient tolerated the procedure well with no immediate complications Comments: Distraction with posterior pressure on the humeral head with palpable clunk and improvement of pain.      Medications Ordered in ED Medications  lidocaine-EPINEPHrine (XYLOCAINE W/EPI) 2 %-1:200000 (PF) injection 10 mL (10 mLs Intradermal Given by Other 07/31/20 1706)  fentaNYL (SUBLIMAZE) injection 100 mcg (100 mcg Intravenous Given 07/31/20 1701)  ondansetron (ZOFRAN) injection 4 mg (4 mg Intravenous Given 07/31/20 1659)   fentaNYL (SUBLIMAZE) injection 100 mcg (100 mcg Intravenous Given 07/31/20 1734)    ED Course  I have reviewed the triage vital signs and the nursing notes.  Pertinent labs & imaging results that were available during my care of the patient were reviewed by me and considered in my medical decision making (see chart for details).    MDM Rules/Calculators/A&P                          35 yo F with a chief complaints of a left shoulder dislocation.  Is the second time this is happened to her.  Occurred while she was trying to kill a fly.  No trauma.  Plain film viewed by me with dislocation.  Patient is [redacted] weeks pregnant and so attempt to reduce without sedation.  Repeat xray with subluxation.  Repeat reduction with improvement.   6:30 PM:  I have discussed the diagnosis/risks/treatment options with the patient and believe the pt to be eligible for discharge home to follow-up with PCP. We also discussed returning to the ED immediately if new or worsening sx occur. We discussed the sx which are most concerning (e.g., sudden worsening pain, fever, inability to tolerate by mouths) that necessitate immediate return. Medications administered to the patient during their visit and any new prescriptions provided to the patient are listed below.  Medications given during this visit Medications  lidocaine-EPINEPHrine (XYLOCAINE W/EPI) 2 %-1:200000 (PF) injection 10 mL (10 mLs Intradermal Given by Other 07/31/20 1706)  fentaNYL (SUBLIMAZE) injection 100 mcg (100 mcg Intravenous Given 07/31/20 1701)  ondansetron (ZOFRAN) injection 4 mg (4 mg Intravenous Given 07/31/20 1659)  fentaNYL (SUBLIMAZE) injection 100 mcg (100 mcg Intravenous Given 07/31/20 1734)     The patient appears reasonably screen and/or stabilized for discharge and I doubt any other medical condition or other [facility] requiring further screening, evaluation, or treatment in the ED at this time prior to discharge.   Final Clinical Impression(s) /  ED Diagnoses Final diagnoses:  Shoulder dislocation, left, initial encounter    Rx / DC Orders ED Discharge Orders     None        Melene Plan, DO 07/31/20 1842

## 2020-07-31 NOTE — ED Triage Notes (Signed)
Obvious left shoulder deformity after ' swatting at a fly'.

## 2020-07-31 NOTE — ED Notes (Signed)
AVS provided to client, reviewed AVS with client and mother, pt teaching done re: comfort measures at home, provided ice pack and pt teaching on use of ice pack for pain also discussed. reinforced recommendation to make a follow up appt with Ortho MD per the ED MD recommendations. Opportunity for questions provided prior to DC to home with mother

## 2020-07-31 NOTE — ED Notes (Signed)
X-ray at bedside

## 2020-07-31 NOTE — Discharge Instructions (Addendum)
Tylenol for pain.  Follow up with ortho.

## 2020-08-17 ENCOUNTER — Encounter: Payer: Self-pay | Admitting: Obstetrics & Gynecology

## 2020-08-17 ENCOUNTER — Other Ambulatory Visit: Payer: Self-pay

## 2020-08-17 ENCOUNTER — Ambulatory Visit (INDEPENDENT_AMBULATORY_CARE_PROVIDER_SITE_OTHER): Payer: 59 | Admitting: Obstetrics & Gynecology

## 2020-08-17 VITALS — BP 129/79 | HR 79 | Wt 181.0 lb

## 2020-08-17 DIAGNOSIS — Z3A14 14 weeks gestation of pregnancy: Secondary | ICD-10-CM

## 2020-08-17 DIAGNOSIS — O0990 Supervision of high risk pregnancy, unspecified, unspecified trimester: Secondary | ICD-10-CM

## 2020-08-17 DIAGNOSIS — Z2839 Other underimmunization status: Secondary | ICD-10-CM

## 2020-08-17 DIAGNOSIS — Z8632 Personal history of gestational diabetes: Secondary | ICD-10-CM

## 2020-08-17 DIAGNOSIS — O09899 Supervision of other high risk pregnancies, unspecified trimester: Secondary | ICD-10-CM | POA: Insufficient documentation

## 2020-08-17 NOTE — Progress Notes (Signed)
   PRENATAL VISIT NOTE  Subjective:  Tonya Douglas is a 35 y.o. G2P1001 at [redacted]w[redacted]d being seen today for ongoing prenatal care.  She is currently monitored for the following issues for this high-risk pregnancy and has Supervision of high risk pregnancy, antepartum and History of gestational diabetes mellitus (GDM) on their problem list.  Patient reports  recent shoulder dislocation  .  Contractions: Not present. Vag. Bleeding: None.  Movement: Present. Denies leaking of fluid.   The following portions of the patient's history were reviewed and updated as appropriate: allergies, current medications, past family history, past medical history, past social history, past surgical history and problem list.   Objective:   Vitals:   08/17/20 1613  BP: 129/79  Pulse: 79  Weight: 181 lb (82.1 kg)    Fetal Status: Fetal Heart Rate (bpm): 150   Movement: Present     General:  Alert, oriented and cooperative. Patient is in no acute distress.  Skin: Skin is warm and dry. No rash noted.   Cardiovascular: Normal heart rate noted  Respiratory: Normal respiratory effort, no problems with respiration noted  Abdomen: Soft, gravid, appropriate for gestational age.  Pain/Pressure: Absent     Pelvic: Cervical exam deferred        Extremities: Normal range of motion.  Edema: None  Mental Status: Normal mood and affect. Normal behavior. Normal judgment and thought content.   Assessment and Plan:  Pregnancy: G2P1001 at [redacted]w[redacted]d 1. Supervision of high risk pregnancy, antepartum Reviewed PNL Needs AFP on next visit. .   2. History of gestational diabetes mellitus (GDM) Normal 1st trimester screening.   Preterm labor symptoms and general obstetric precautions including but not limited to vaginal bleeding, contractions, leaking of fluid and fetal movement were reviewed in detail with the patient. Please refer to After Visit Summary for other counseling recommendations.   No follow-ups on file.  Future  Appointments  Date Time Provider Department Center  09/21/2020 11:15 AM Levie Heritage, DO [facility] None  09/21/2020  1:15 PM [facility] NURSE [facility]  09/21/2020  1:30 PM [facility] US3 [facility]    Willodean Rosenthal, MD

## 2020-09-14 ENCOUNTER — Encounter: Payer: 59 | Admitting: Family Medicine

## 2020-09-21 ENCOUNTER — Encounter: Payer: Self-pay | Admitting: *Deleted

## 2020-09-21 ENCOUNTER — Other Ambulatory Visit: Payer: Self-pay | Admitting: *Deleted

## 2020-09-21 ENCOUNTER — Ambulatory Visit: Payer: 59 | Admitting: *Deleted

## 2020-09-21 ENCOUNTER — Ambulatory Visit: Payer: 59 | Attending: Family Medicine

## 2020-09-21 ENCOUNTER — Encounter: Payer: 59 | Admitting: Family Medicine

## 2020-09-21 ENCOUNTER — Ambulatory Visit (INDEPENDENT_AMBULATORY_CARE_PROVIDER_SITE_OTHER): Payer: 59 | Admitting: Family Medicine

## 2020-09-21 ENCOUNTER — Other Ambulatory Visit: Payer: Self-pay

## 2020-09-21 VITALS — BP 118/64 | HR 85

## 2020-09-21 VITALS — BP 118/69 | HR 92 | Wt 183.0 lb

## 2020-09-21 DIAGNOSIS — Z3A19 19 weeks gestation of pregnancy: Secondary | ICD-10-CM

## 2020-09-21 DIAGNOSIS — O0990 Supervision of high risk pregnancy, unspecified, unspecified trimester: Secondary | ICD-10-CM

## 2020-09-21 DIAGNOSIS — Z8632 Personal history of gestational diabetes: Secondary | ICD-10-CM | POA: Insufficient documentation

## 2020-09-21 DIAGNOSIS — R638 Other symptoms and signs concerning food and fluid intake: Secondary | ICD-10-CM

## 2020-09-21 DIAGNOSIS — Z2839 Other underimmunization status: Secondary | ICD-10-CM | POA: Insufficient documentation

## 2020-09-21 DIAGNOSIS — O09899 Supervision of other high risk pregnancies, unspecified trimester: Secondary | ICD-10-CM

## 2020-09-21 NOTE — Progress Notes (Signed)
   PRENATAL VISIT NOTE  Subjective:  Tonya Douglas is a 35 y.o. G2P1001 at [redacted]w[redacted]d being seen today for ongoing prenatal care.  She is currently monitored for the following issues for this low-risk pregnancy and has Supervision of high risk pregnancy, antepartum; History of gestational diabetes mellitus (GDM); and Rubella non-immune status, antepartum on their problem list.  Patient reports  dislocated left shoulder a 6 weeks ago. Had it reduced in ED. Saw PT - was given exercises. Improved.  .  Contractions: Not present. Vag. Bleeding: None.  Movement: Present. Denies leaking of fluid.   The following portions of the patient's history were reviewed and updated as appropriate: allergies, current medications, past family history, past medical history, past social history, past surgical history and problem list.   Objective:   Vitals:   09/21/20 1114  BP: 118/69  Pulse: 92  Weight: 183 lb (83 kg)    Fetal Status: Fetal Heart Rate (bpm): 148   Movement: Present     General:  Alert, oriented and cooperative. Patient is in no acute distress.  Skin: Skin is warm and dry. No rash noted.   Cardiovascular: Normal heart rate noted  Respiratory: Normal respiratory effort, no problems with respiration noted  Abdomen: Soft, gravid, appropriate for gestational age.  Pain/Pressure: Present     Pelvic: Cervical exam deferred        Extremities: Normal range of motion.  Edema: None  Mental Status: Normal mood and affect. Normal behavior. Normal judgment and thought content.   Assessment and Plan:  Pregnancy: G2P1001 at [redacted]w[redacted]d 1. [redacted] weeks gestation of pregnancy - AFP, Serum, Open Spina Bifida  2. Supervision of high risk pregnancy, antepartum FHT and FH normal  3. History of gestational diabetes mellitus (GDM) First trimester screening normal  4. Rubella non-immune status, antepartum MMR post delivery   Preterm labor symptoms and general obstetric precautions including but not limited to  vaginal bleeding, contractions, leaking of fluid and fetal movement were reviewed in detail with the patient. Please refer to After Visit Summary for other counseling recommendations.   No follow-ups on file.  Future Appointments  Date Time Provider [place]  09/21/2020  1:15 PM [facility] NURSE [facility]  09/21/2020  1:30 PM [facility] US3 [facility] [place], DO

## 2020-09-22 ENCOUNTER — Encounter: Payer: Self-pay | Admitting: Family Medicine

## 2020-09-22 DIAGNOSIS — O43199 Other malformation of placenta, unspecified trimester: Secondary | ICD-10-CM | POA: Insufficient documentation

## 2020-09-23 LAB — AFP, SERUM, OPEN SPINA BIFIDA
AFP MoM: 1.09
AFP Value: 52.5 ng/mL
Gest. Age on Collection Date: 19.3 weeks
Maternal Age At EDD: 33 yr
OSBR Risk 1 IN: 9231
Test Results:: NEGATIVE
Weight: 183 [lb_av]

## 2020-10-21 ENCOUNTER — Ambulatory Visit: Payer: 59 | Attending: Obstetrics

## 2020-10-21 ENCOUNTER — Other Ambulatory Visit: Payer: Self-pay

## 2020-10-21 ENCOUNTER — Encounter: Payer: Self-pay | Admitting: *Deleted

## 2020-10-21 ENCOUNTER — Ambulatory Visit: Payer: 59 | Admitting: *Deleted

## 2020-10-21 VITALS — BP 119/69 | HR 97

## 2020-10-21 DIAGNOSIS — Z3A23 23 weeks gestation of pregnancy: Secondary | ICD-10-CM | POA: Diagnosis not present

## 2020-10-21 DIAGNOSIS — O99212 Obesity complicating pregnancy, second trimester: Secondary | ICD-10-CM | POA: Diagnosis not present

## 2020-10-21 DIAGNOSIS — R638 Other symptoms and signs concerning food and fluid intake: Secondary | ICD-10-CM | POA: Diagnosis present

## 2020-10-21 DIAGNOSIS — O43192 Other malformation of placenta, second trimester: Secondary | ICD-10-CM | POA: Diagnosis not present

## 2020-10-21 DIAGNOSIS — E669 Obesity, unspecified: Secondary | ICD-10-CM | POA: Diagnosis not present

## 2020-10-21 DIAGNOSIS — Z8632 Personal history of gestational diabetes: Secondary | ICD-10-CM | POA: Diagnosis present

## 2020-10-26 ENCOUNTER — Ambulatory Visit (INDEPENDENT_AMBULATORY_CARE_PROVIDER_SITE_OTHER): Payer: 59 | Admitting: Family Medicine

## 2020-10-26 ENCOUNTER — Other Ambulatory Visit: Payer: Self-pay

## 2020-10-26 ENCOUNTER — Encounter: Payer: Self-pay | Admitting: Family Medicine

## 2020-10-26 VITALS — BP 110/62 | HR 118 | Wt 184.0 lb

## 2020-10-26 DIAGNOSIS — Z2839 Other underimmunization status: Secondary | ICD-10-CM

## 2020-10-26 DIAGNOSIS — O0990 Supervision of high risk pregnancy, unspecified, unspecified trimester: Secondary | ICD-10-CM

## 2020-10-26 DIAGNOSIS — Z8632 Personal history of gestational diabetes: Secondary | ICD-10-CM

## 2020-10-26 DIAGNOSIS — O43199 Other malformation of placenta, unspecified trimester: Secondary | ICD-10-CM

## 2020-10-26 DIAGNOSIS — O09899 Supervision of other high risk pregnancies, unspecified trimester: Secondary | ICD-10-CM

## 2020-10-26 NOTE — Progress Notes (Signed)
   Subjective:  Tonya Douglas is a 35 y.o. G2P1001 at 84w3dbeing seen today for ongoing prenatal care.  She is currently monitored for the following issues for this high-risk pregnancy and has Supervision of high risk pregnancy, antepartum; History of gestational diabetes mellitus (GDM); and Rubella non-immune status, antepartum on their problem list.  Patient reports no complaints.  Contractions: Not present. Vag. Bleeding: None.  Movement: Present. Denies leaking of fluid.   The following portions of the patient's history were reviewed and updated as appropriate: allergies, current medications, past family history, past medical history, past social history, past surgical history and problem list. Problem list updated.  Objective:   Vitals:   10/26/20 1038  BP: 110/62  Pulse: (!) 118  Weight: 184 lb (83.5 kg)    Fetal Status: Fetal Heart Rate (bpm): 155 Fundal Height: 25 cm Movement: Present     General:  Alert, oriented and cooperative. Patient is in no acute distress.  Skin: Skin is warm and dry. No rash noted.   Cardiovascular: Normal heart rate noted  Respiratory: Normal respiratory effort, no problems with respiration noted  Abdomen: Soft, gravid, appropriate for gestational age. Pain/Pressure: Absent     Pelvic: Vag. Bleeding: None     Cervical exam deferred        Extremities: Normal range of motion.  Edema: None  Mental Status: Normal mood and affect. Normal behavior. Normal judgment and thought content.   Urinalysis:      Assessment and Plan:  Pregnancy: G2P1001 at 246w3d1. Rubella non-immune status, antepartum Offer MMR post partum  2. Supervision of high risk pregnancy, antepartum BP and FHR normal FH normal Counseled extensively on contraception, would like to avoid hormones, unsure if she wants more children. Discussed copper IUD at length, she'll think about it Advised of 28wk labs at next visit  3. History of gestational diabetes mellitus (GDM) A1c  normal at new OB intake  4. Marginal insertion of umbilical cord affecting management of mother Resolved on most recent [place] further follow ups scheduled  Preterm labor symptoms and general obstetric precautions including but not limited to vaginal bleeding, contractions, leaking of fluid and fetal movement were reviewed in detail with the patient. Please refer to After Visit Summary for other counseling recommendations.  Return in about 4 weeks (around 11/23/2020) [facility], 28 wk labs.   EcClarnce FlockMD

## 2020-11-23 ENCOUNTER — Other Ambulatory Visit: Payer: Self-pay

## 2020-11-23 ENCOUNTER — Ambulatory Visit (INDEPENDENT_AMBULATORY_CARE_PROVIDER_SITE_OTHER): Payer: 59 | Admitting: Family Medicine

## 2020-11-23 VITALS — BP 104/65 | HR 103 | Wt 189.0 lb

## 2020-11-23 DIAGNOSIS — Z23 Encounter for immunization: Secondary | ICD-10-CM

## 2020-11-23 DIAGNOSIS — Z8632 Personal history of gestational diabetes: Secondary | ICD-10-CM

## 2020-11-23 DIAGNOSIS — O0993 Supervision of high risk pregnancy, unspecified, third trimester: Secondary | ICD-10-CM

## 2020-11-23 DIAGNOSIS — O0990 Supervision of high risk pregnancy, unspecified, unspecified trimester: Secondary | ICD-10-CM

## 2020-11-23 DIAGNOSIS — Z3A28 28 weeks gestation of pregnancy: Secondary | ICD-10-CM

## 2020-11-23 DIAGNOSIS — A6009 Herpesviral infection of other urogenital tract: Secondary | ICD-10-CM

## 2020-11-23 DIAGNOSIS — Z2839 Other underimmunization status: Secondary | ICD-10-CM

## 2020-11-23 DIAGNOSIS — O09893 Supervision of other high risk pregnancies, third trimester: Secondary | ICD-10-CM

## 2020-11-23 DIAGNOSIS — O98319 Other infections with a predominantly sexual mode of transmission complicating pregnancy, unspecified trimester: Secondary | ICD-10-CM | POA: Insufficient documentation

## 2020-11-23 DIAGNOSIS — O98313 Other infections with a predominantly sexual mode of transmission complicating pregnancy, third trimester: Secondary | ICD-10-CM

## 2020-11-23 NOTE — Progress Notes (Signed)
   PRENATAL VISIT NOTE  Subjective:  Tonya Douglas is a 35 y.o. G2P1001 at 77w3dbeing seen today for ongoing prenatal care.  She is currently monitored for the following issues for this high-risk pregnancy and has Supervision of high risk pregnancy, antepartum; History of gestational diabetes mellitus (GDM); Rubella non-immune status, antepartum; and Genital herpes affecting pregnancy, antepartum on their problem list.  Patient reports no complaints.  Contractions: Not present. Vag. Bleeding: None.  Movement: Present. Denies leaking of fluid.   The following portions of the patient's history were reviewed and updated as appropriate: allergies, current medications, past family history, past medical history, past social history, past surgical history and problem list.   Objective:   Vitals:   11/23/20 0830  BP: 104/65  Pulse: (!) 103  Weight: 189 lb (85.7 kg)    Fetal Status: Fetal Heart Rate (bpm): 146   Movement: Present     General:  Alert, oriented and cooperative. Patient is in no acute distress.  Skin: Skin is warm and dry. No rash noted.   Cardiovascular: Normal heart rate noted  Respiratory: Normal respiratory effort, no problems with respiration noted  Abdomen: Soft, gravid, appropriate for gestational age.  Pain/Pressure: Present     Pelvic: Cervical exam deferred        Extremities: Normal range of motion.  Edema: None  Mental Status: Normal mood and affect. Normal behavior. Normal judgment and thought content.   Assessment and Plan:  Pregnancy: G2P1001 at 251w3d. [redacted] weeks gestation of pregnancy - CBC - Glucose Tolerance, 2 Hours w/1 Hour - HIV Antibody (routine testing w rflx) - RPR - Tdap vaccine greater than or equal to 7yo IM  2. Supervision of high risk pregnancy, antepartum FHT and FH normal  3. History of gestational diabetes mellitus (GDM)  4. Rubella non-immune status, antepartum MMR post delivery  5. Genital herpes affecting pregnancy,  antepartum Suppression at 34-35 weeks.  Preterm labor symptoms and general obstetric precautions including but not limited to vaginal bleeding, contractions, leaking of fluid and fetal movement were reviewed in detail with the patient. Please refer to After Visit Summary for other counseling recommendations.   No follow-ups on file.  Future Appointments  Date Time Provider [place]11/03/2020 10:15 AM StTruett MainlandDO [facility] None  12/21/2020 10:15 AM StTruett MainlandDO [facility] None  01/04/2021 10:15 AM StTruett MainlandDO [facility] None    JaTruett MainlandDO

## 2020-11-24 LAB — CBC
Hematocrit: 34.8 % (ref 34.0–46.6)
Hemoglobin: 11.5 g/dL (ref 11.1–15.9)
MCH: 29.4 pg (ref 26.6–33.0)
MCHC: 33 g/dL (ref 31.5–35.7)
MCV: 89 fL (ref 79–97)
Platelets: 265 10*3/uL (ref 150–450)
RBC: 3.91 x10E6/uL (ref 3.77–5.28)
RDW: 13.4 % (ref 11.7–15.4)
WBC: 12.6 10*3/uL — ABNORMAL HIGH (ref 3.4–10.8)

## 2020-11-24 LAB — GLUCOSE TOLERANCE, 2 HOURS W/ 1HR
Glucose, 1 hour: 188 mg/dL — ABNORMAL HIGH (ref 70–179)
Glucose, 2 hour: 175 mg/dL — ABNORMAL HIGH (ref 70–152)
Glucose, Fasting: 74 mg/dL (ref 70–91)

## 2020-11-24 LAB — RPR: RPR Ser Ql: NONREACTIVE

## 2020-11-24 LAB — HIV ANTIBODY (ROUTINE TESTING W REFLEX): HIV Screen 4th Generation wRfx: NONREACTIVE

## 2020-11-30 ENCOUNTER — Other Ambulatory Visit: Payer: Self-pay

## 2020-11-30 MED ORDER — ACCU-CHEK SOFTCLIX LANCETS MISC: 1.0000 | Freq: Four times a day (QID) | 12 refills | Status: DC

## 2020-11-30 MED ORDER — ACCU-CHEK AVIVA PLUS VI STRP: ORAL_STRIP | 12 refills | Status: DC

## 2020-12-07 ENCOUNTER — Other Ambulatory Visit: Payer: Self-pay

## 2020-12-07 ENCOUNTER — Ambulatory Visit (INDEPENDENT_AMBULATORY_CARE_PROVIDER_SITE_OTHER): Payer: 59 | Admitting: Family Medicine

## 2020-12-07 VITALS — BP 101/53 | HR 105 | Wt 191.0 lb

## 2020-12-07 DIAGNOSIS — O24419 Gestational diabetes mellitus in pregnancy, unspecified control: Secondary | ICD-10-CM

## 2020-12-07 DIAGNOSIS — O0990 Supervision of high risk pregnancy, unspecified, unspecified trimester: Secondary | ICD-10-CM

## 2020-12-07 DIAGNOSIS — Z2839 Other underimmunization status: Secondary | ICD-10-CM

## 2020-12-07 DIAGNOSIS — A6009 Herpesviral infection of other urogenital tract: Secondary | ICD-10-CM

## 2020-12-07 DIAGNOSIS — O09899 Supervision of other high risk pregnancies, unspecified trimester: Secondary | ICD-10-CM

## 2020-12-07 DIAGNOSIS — O98319 Other infections with a predominantly sexual mode of transmission complicating pregnancy, unspecified trimester: Secondary | ICD-10-CM

## 2020-12-07 MED ORDER — NYSTATIN-TRIAMCINOLONE 100000-0.1 UNIT/GM-% EX OINT: 1.0000 "application " | TOPICAL_OINTMENT | Freq: Two times a day (BID) | CUTANEOUS | 0 refills | Status: DC

## 2020-12-07 NOTE — Progress Notes (Signed)
   PRENATAL VISIT NOTE  Subjective:  Tonya Douglas is a 35 y.o. G2P1001 at 22w3dbeing seen today for ongoing prenatal care.  She is currently monitored for the following issues for this high-risk pregnancy and has Gestational diabetes mellitus (GDM) in third trimester; Supervision of high risk pregnancy, antepartum; History of gestational diabetes mellitus (GDM); Rubella non-immune status, antepartum; and Genital herpes affecting pregnancy, antepartum on their problem list.  Patient reports no complaints.  Contractions: Not present. Vag. Bleeding: None.  Movement: Present. Denies leaking of fluid.   The following portions of the patient's history were reviewed and updated as appropriate: allergies, current medications, past family history, past medical history, past social history, past surgical history and problem list.   Objective:   Vitals:   12/07/20 1021  BP: (!) 101/53  Pulse: (!) 105  Weight: 191 lb (86.6 kg)    Fetal Status: Fetal Heart Rate (bpm): 145   Movement: Present     General:  Alert, oriented and cooperative. Patient is in no acute distress.  Skin: Skin is warm and dry. No rash noted.   Cardiovascular: Normal heart rate noted  Respiratory: Normal respiratory effort, no problems with respiration noted  Abdomen: Soft, gravid, appropriate for gestational age.  Pain/Pressure: Present     Pelvic: Cervical exam deferred        Extremities: Normal range of motion.  Edema: None  Mental Status: Normal mood and affect. Normal behavior. Normal judgment and thought content.   Assessment and Plan:  Pregnancy: G2P1001 at 322w3d. Supervision of high risk pregnancy, antepartum FHT and FH normal. Note written for patient's mother to get time off from work around delivery  2. Genital herpes affecting pregnancy, antepartum Suppression at 35-36 weeks  3. Rubella non-immune status, antepartum MMR post delivery  4. Gestational diabetes mellitus (GDM) in third trimester,  gestational diabetes method of control unspecified Started testing on Monday. Postprandials normal. 2 elevated fastings. Will continue to watch and adjust management.  Discussed bedtime snack  Preterm labor symptoms and general obstetric precautions including but not limited to vaginal bleeding, contractions, leaking of fluid and fetal movement were reviewed in detail with the patient. Please refer to After Visit Summary for other counseling recommendations.   No follow-ups on file.  Future Appointments  Date Time Provider De[place]11/16/2022  9:15 AM StTruett MainlandDO [facility] None  01/04/2021 10:15 AM StTruett MainlandDO [facility] None  01/18/2021  8:35 AM StTruett MainlandDO [facility] None  01/18/2021 12:30 PM [facility] 12:45 PM [facility]12/21/2022  8:15 AM StTruett MainlandDO [facility] None  02/01/2021  9:15 AM StTruett MainlandDO [facility] None    JaTruett MainlandDO

## 2020-12-21 ENCOUNTER — Other Ambulatory Visit: Payer: Self-pay

## 2020-12-21 ENCOUNTER — Ambulatory Visit (INDEPENDENT_AMBULATORY_CARE_PROVIDER_SITE_OTHER): Payer: 59 | Admitting: Family Medicine

## 2020-12-21 VITALS — BP 112/67 | HR 87 | Wt 187.0 lb

## 2020-12-21 DIAGNOSIS — Z2839 Other underimmunization status: Secondary | ICD-10-CM

## 2020-12-21 DIAGNOSIS — R052 Subacute cough: Secondary | ICD-10-CM

## 2020-12-21 DIAGNOSIS — O24415 Gestational diabetes mellitus in pregnancy, controlled by oral hypoglycemic drugs: Secondary | ICD-10-CM

## 2020-12-21 DIAGNOSIS — Z3A32 32 weeks gestation of pregnancy: Secondary | ICD-10-CM

## 2020-12-21 DIAGNOSIS — O0990 Supervision of high risk pregnancy, unspecified, unspecified trimester: Secondary | ICD-10-CM

## 2020-12-21 DIAGNOSIS — O98319 Other infections with a predominantly sexual mode of transmission complicating pregnancy, unspecified trimester: Secondary | ICD-10-CM

## 2020-12-21 DIAGNOSIS — A6009 Herpesviral infection of other urogenital tract: Secondary | ICD-10-CM

## 2020-12-21 DIAGNOSIS — O09899 Supervision of other high risk pregnancies, unspecified trimester: Secondary | ICD-10-CM

## 2020-12-21 MED ORDER — METFORMIN HCL 500 MG PO TABS: 500.0000 mg | ORAL_TABLET | Freq: Every day | ORAL | 1 refills | Status: DC

## 2020-12-21 NOTE — Progress Notes (Signed)
Patient reporting cough for three weeks and now over the weekend she developed congestion. Armandina Stammer RN

## 2020-12-21 NOTE — Progress Notes (Signed)
   PRENATAL VISIT NOTE  Subjective:  Tonya Douglas is a 35 y.o. G2P1001 at 85w3dbeing seen today for ongoing prenatal care.  She is currently monitored for the following issues for this high-risk pregnancy and has Gestational diabetes mellitus (GDM) in third trimester; Supervision of high risk pregnancy, antepartum; History of gestational diabetes mellitus (GDM); Rubella non-immune status, antepartum; and Genital herpes affecting pregnancy, antepartum on their problem list.  Patient reports  cough for the past 4-5 weeks. Initially had cold, then turned into cough. Had cough for 2-3 weeks. Now has cough with congestion .  Contractions: Not present. Vag. Bleeding: None.  Movement: Present. Denies leaking of fluid.   The following portions of the patient's history were reviewed and updated as appropriate: allergies, current medications, past family history, past medical history, past social history, past surgical history and problem list.   Objective:   Vitals:   12/21/20 0919  BP: 112/67  Pulse: 87  Weight: 187 lb (84.8 kg)    Fetal Status: Fetal Heart Rate (bpm): 135   Movement: Present     General:  Alert, oriented and cooperative. Patient is in no acute distress.  Skin: Skin is warm and dry. No rash noted.   Cardiovascular: Normal heart rate noted  Respiratory: Normal respiratory effort, no problems with respiration noted  Abdomen: Soft, gravid, appropriate for gestational age.  Pain/Pressure: Present     Pelvic: Cervical exam deferred        Extremities: Normal range of motion.  Edema: None  Mental Status: Normal mood and affect. Normal behavior. Normal judgment and thought content.   Assessment and Plan:  Pregnancy: G2P1001 at 344w3d. [redacted] weeks gestation of pregnancy  2. Supervision of high risk pregnancy, antepartum FHT and FH normal.  3. Gestational diabetes mellitus (GDM) in third trimester controlled on oral hypoglycemic drug All fasting CBGs elevated. PP has a mix -  likely due to fastings being high. Discussed medication options - Insulin vs Metformin. Patient would like to start metfromin. Take at night.  Start BPP Will need induction at 39 weeks. - metFORMIN (GLUCOPHAGE) 500 MG tablet; Take 1 tablet (500 mg total) by mouth daily with supper.  Dispense: 90 tablet; Refill: 1 - [place] FETAL BPP WO NON STRESS; Future  4. Genital herpes affecting pregnancy, antepartum Valtrex at 35 weeks  5. Subacute cough Currently has congestion. No sinus tenderness.  Likely viral. Symptomatic treatment with robitussin or mucinex DM  6. Rubella non-immune status, antepartum MMR post delivery  Preterm labor symptoms and general obstetric precautions including but not limited to vaginal bleeding, contractions, leaking of fluid and fetal movement were reviewed in detail with the patient. Please refer to After Visit Summary for other counseling recommendations.   No follow-ups on file.  Future Appointments  Date Time Provider [place]11/18/2022 11:15 AM [facility]11/30/2022 10:15 AM StTruett MainlandDO [facility] None  01/09/2021  9:15 AM [facility]12/01/2021 11:15 AM [facility]ST [facility]12/14/2022  8:35 AM StTruett MainlandDO [facility] None  01/18/2021 12:30 PM [facility] NURSE [facility]/14/2022 12:45 PM [facility] US4 [facility]12/21/2022  8:15 AM StNehemiah SettleJaTanna SavoyDO [facility] None  02/01/2021  9:15 AM StTruett MainlandDO [facility] None    JaTruett MainlandDO

## 2020-12-23 ENCOUNTER — Other Ambulatory Visit: Payer: Self-pay

## 2020-12-23 ENCOUNTER — Ambulatory Visit: Payer: 59 | Admitting: *Deleted

## 2020-12-23 ENCOUNTER — Ambulatory Visit (INDEPENDENT_AMBULATORY_CARE_PROVIDER_SITE_OTHER): Payer: 59

## 2020-12-23 VITALS — BP 119/73 | HR 90

## 2020-12-23 DIAGNOSIS — O24415 Gestational diabetes mellitus in pregnancy, controlled by oral hypoglycemic drugs: Secondary | ICD-10-CM | POA: Diagnosis not present

## 2020-12-23 NOTE — Progress Notes (Signed)

## 2020-12-24 IMAGING — US US MFM OB FOLLOW-UP
1 series · 14 of 26 positions shown · non-contrast
Comparison: none

[Series 1: us mfm ob follow-up · 26 acquisitions, 14 frames shown]
[im 1/26]
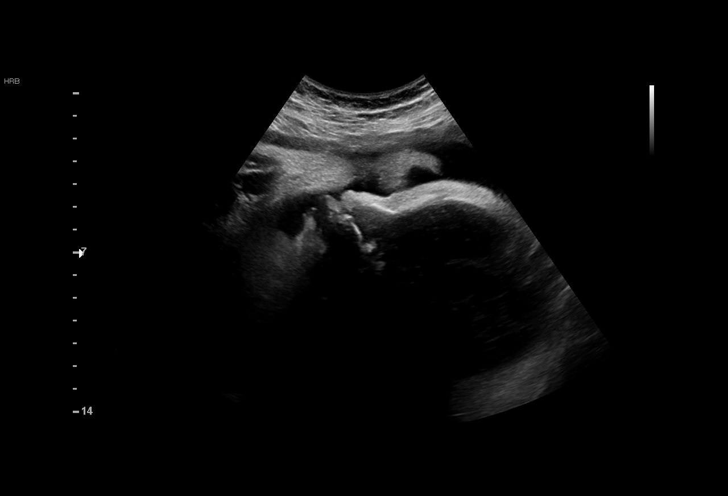
[im 3/26]
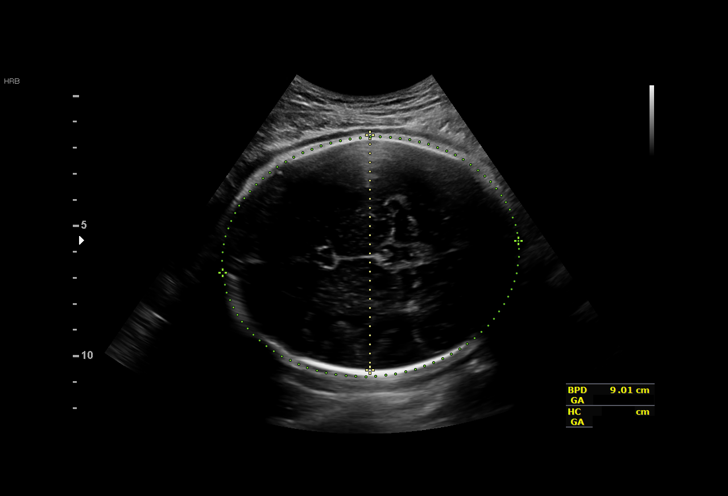
[im 5/26]
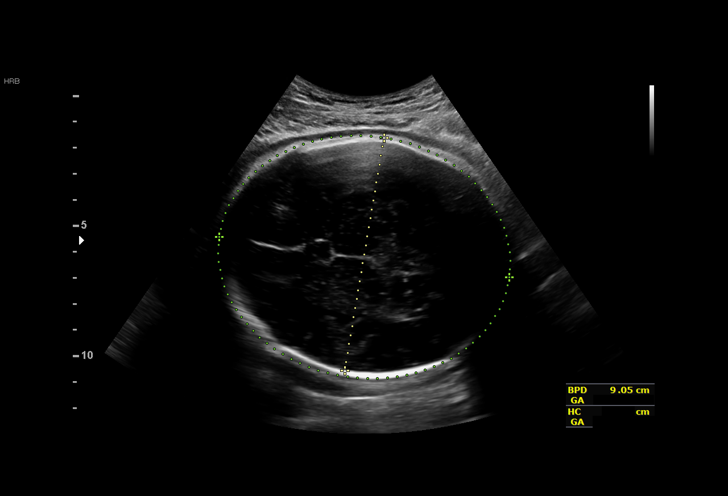
[im 7/26]
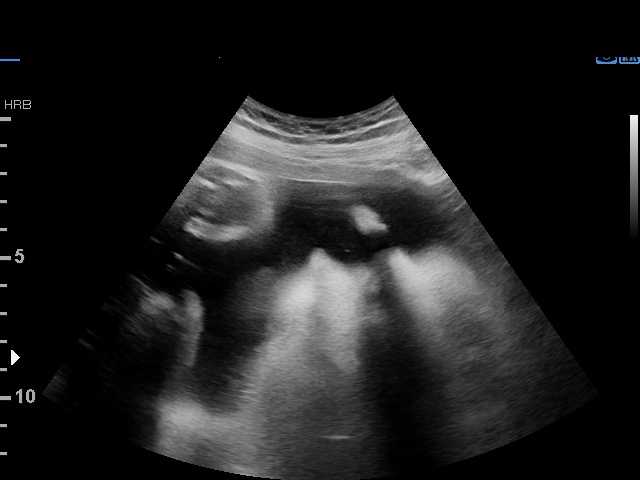
[im 9/26]
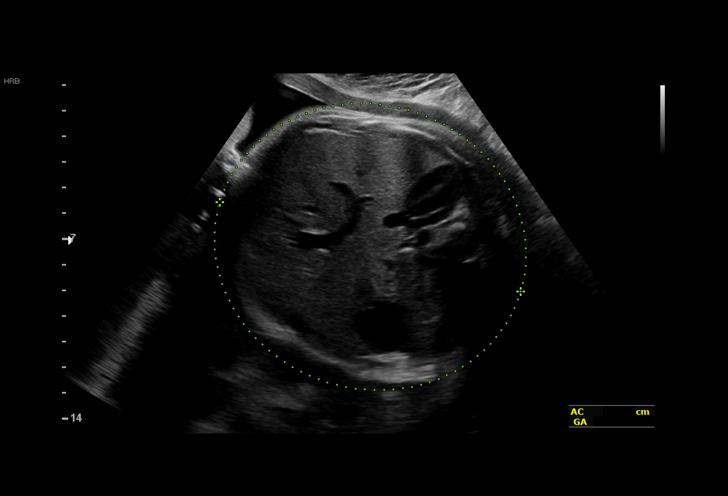
[im 11/26]
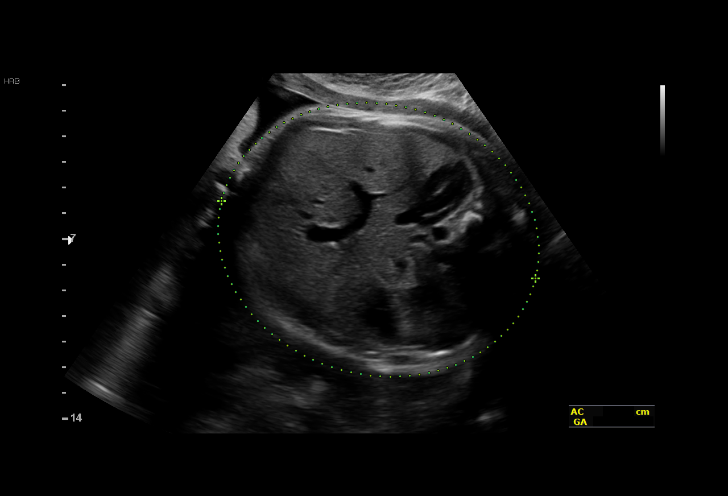
[im 13/26]
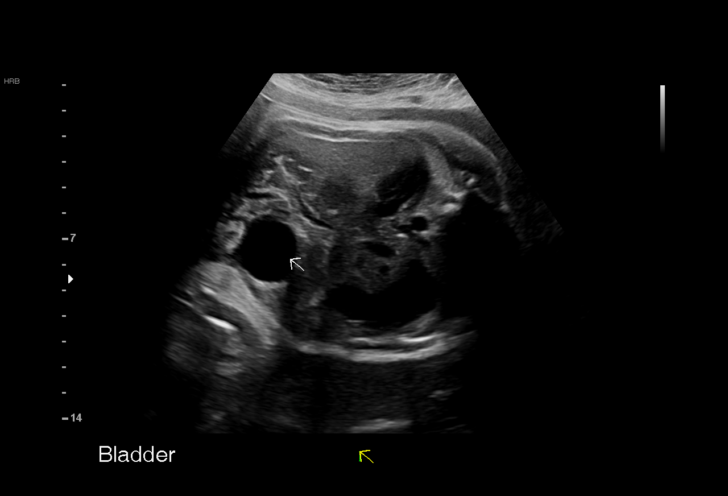
[im 14/26]
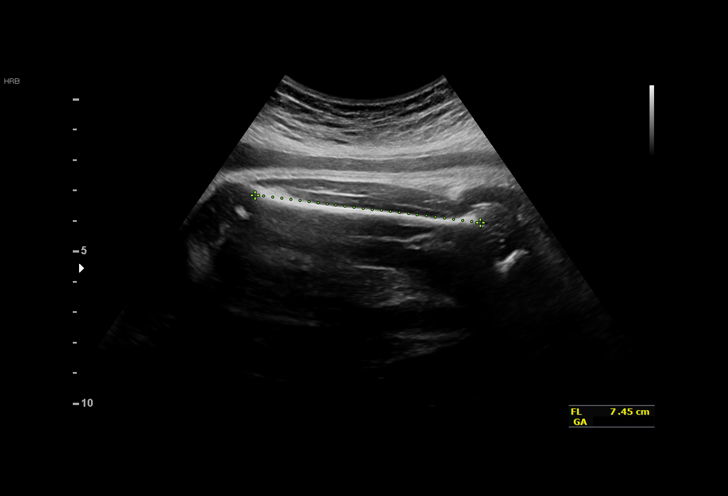
[im 16/26]
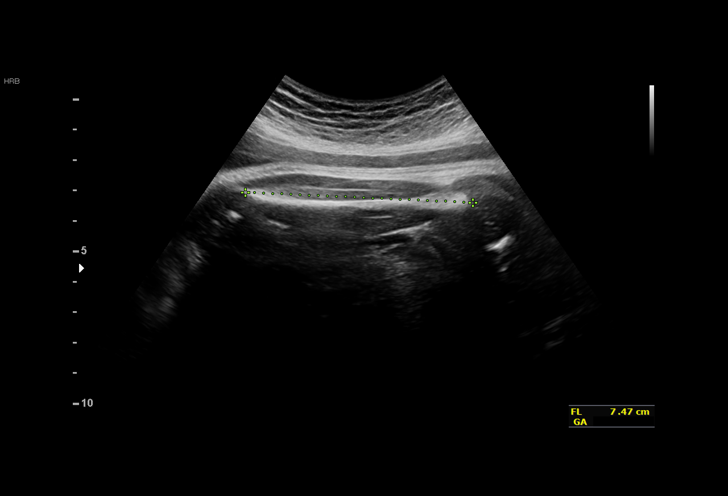
[im 18/26]
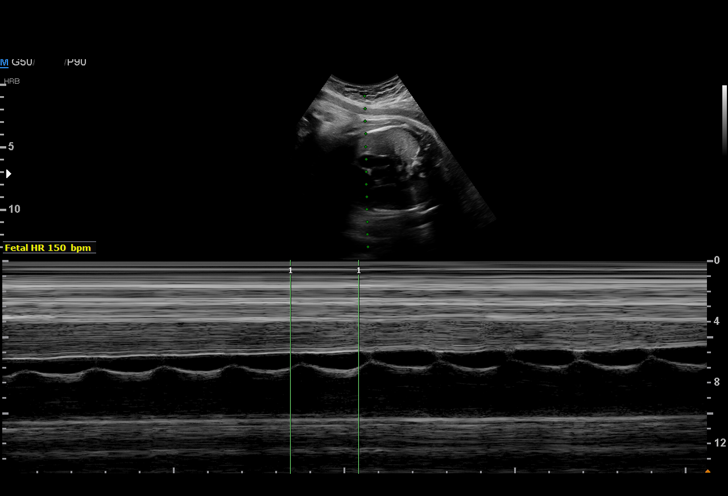
[im 20/26]
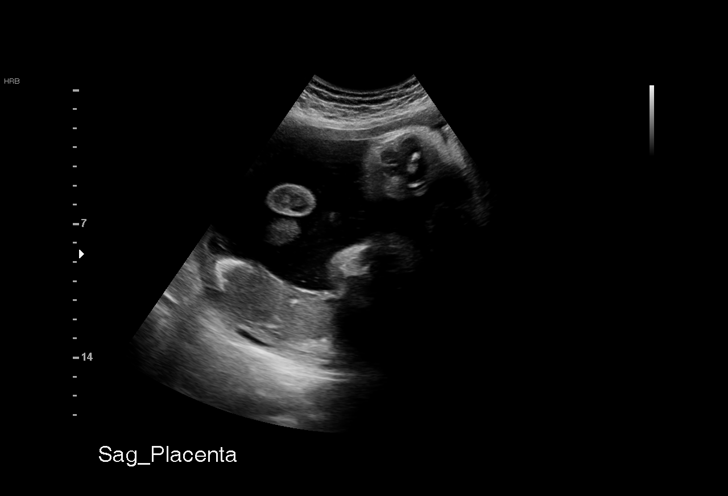
[im 22/26]
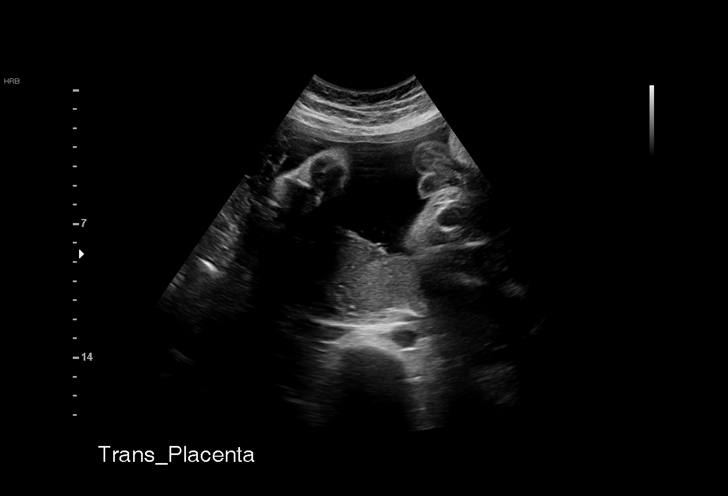
[im 24/26]
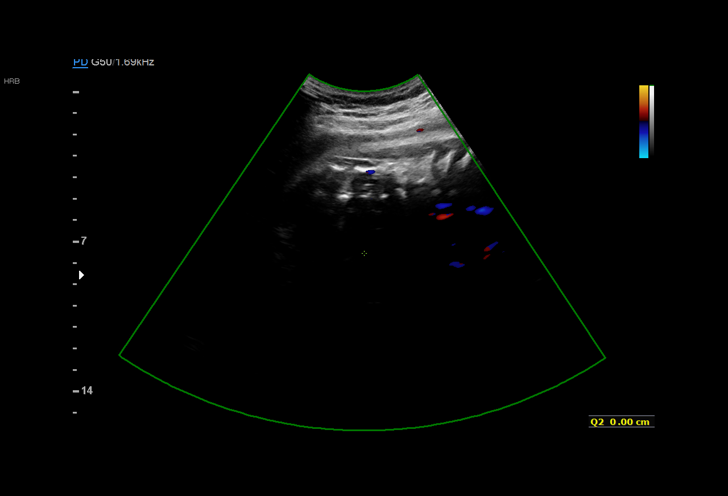
[im 26/26]
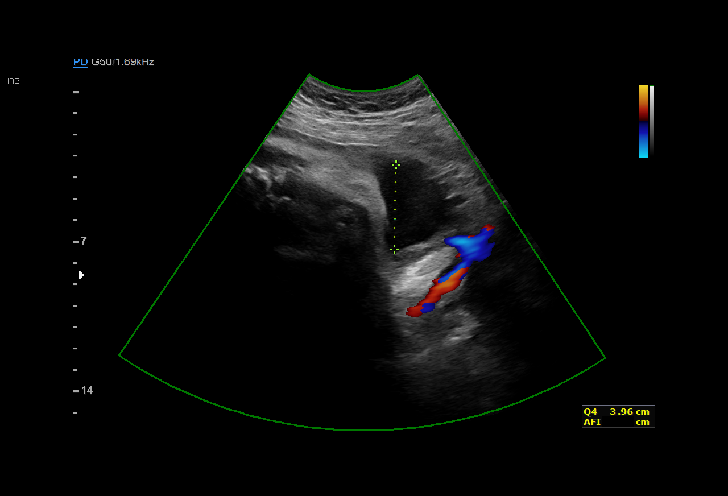

[14 of 26 positions shown; findings below may reference images not displayed]

GARRISON
 ----------------------------------------------------------------------

 ----------------------------------------------------------------------
Indications

  Encounter for other antenatal screening
  follow-up
  Gestational diabetes in pregnancy, diet
  controlled
  Herpes simplex virus (ES)
  39 weeks gestation of pregnancy
 ----------------------------------------------------------------------
Vital Signs

 BMI:
Fetal Evaluation

 Num Of Fetuses:          1
 Fetal Heart Rate(bpm):   150
 Cardiac Activity:        Observed
 Presentation:            Cephalic
 Placenta:                Posterior
 P. Cord Insertion:       Previously Visualized

 Amniotic Fluid
 AFI FV:      Within normal limits

 AFI Sum(cm)     %Tile       Largest Pocket(cm)
 14.22           57

 RUQ(cm)       RLQ(cm)       LUQ(cm)        LLQ(cm)
 6.12          3.96          0
Biometry

 BPD:      90.2  mm     G. Age:  36w 4d         18  %    CI:        77.39   %    70 - 86
                                                         FL/HC:       23.0  %    20.6 -
 HC:      324.6  mm     G. Age:  36w 5d          4  %    HC/AC:       0.89       0.87 -
 AC:      365.1  mm     G. Age:  40w 3d         94  %    FL/BPD:      82.8  %    71 - 87
 FL:       74.7  mm     G. Age:  38w 2d         38  %    FL/AC:       20.5  %    20 - 24
 HUM:      64.9  mm     G. Age:  37w 4d         49  %

 LV:        1.7  mm

 Est. FW:    2502   gm     8 lb 1 oz     85  %
Gestational Age

 LMP:           39w 0d        Date:  05/06/17                 EDD:   02/10/18
 U/S Today:     38w 0d                                        EDD:   02/17/18
 Best:          39w 0d     Det. By:  LMP  (05/06/17)          EDD:   02/10/18
Anatomy

 Cranium:               Appears normal         Aortic Arch:            Previously seen
 Cavum:                 Appears normal         Ductal Arch:            Previously seen
 Ventricles:            Appears normal         Diaphragm:              Appears normal
 Choroid Plexus:        Previously seen        Stomach:                Appears normal, left
                                                                       sided
 Cerebellum:            Previously seen        Abdomen:                Appears normal
 Posterior Fossa:       Previously seen        Abdominal Wall:         Previously seen
 Nuchal Fold:           Previously seen        Cord Vessels:           Previously seen
 Face:                  Orbits and profile     Kidneys:                Appear normal
                        previously seen
 Lips:                  Previously seen        Bladder:                Appears normal
 Thoracic:              Appears normal         Spine:                  Previously seen
 Heart:                 Previously seen        Upper Extremities:      Previously seen
 RVOT:                  Previously seen        Lower Extremities:      Previously seen
 LVOT:                  Previously seen

 Other:  Fetus appears to be a male prev seen. Heels prev visualized.
         Technically difficult due to fetal position.
Cervix Uterus Adnexa

 Cervix
 Not visualized (advanced GA >01wks)
Impression

 Normal interval growth.
Recommendations

 IOL scheduled on [DATE]
 Follow up as clinically indicated.

## 2020-12-27 ENCOUNTER — Encounter: Payer: Self-pay | Admitting: Family Medicine

## 2020-12-28 ENCOUNTER — Other Ambulatory Visit: Payer: Self-pay

## 2020-12-28 ENCOUNTER — Ambulatory Visit (INDEPENDENT_AMBULATORY_CARE_PROVIDER_SITE_OTHER): Payer: 59 | Admitting: General Practice

## 2020-12-28 VITALS — BP 118/73 | HR 98

## 2020-12-28 DIAGNOSIS — O24415 Gestational diabetes mellitus in pregnancy, controlled by oral hypoglycemic drugs: Secondary | ICD-10-CM | POA: Diagnosis not present

## 2020-12-28 MED ORDER — ACCU-CHEK AVIVA PLUS VI STRP: ORAL_STRIP | 12 refills | Status: DC

## 2020-12-28 NOTE — Progress Notes (Signed)
Chart reviewed - agree with CMA/RN documentation.  ° °

## 2020-12-28 NOTE — Progress Notes (Signed)
Patient presents to office today for NST. NST reactive. Patient will return 11/29 for NST/BPP.  Chase Caller RN BSN 12/28/20

## 2020-12-30 ENCOUNTER — Encounter: Payer: Self-pay | Admitting: Family Medicine

## 2021-01-02 ENCOUNTER — Other Ambulatory Visit: Payer: Self-pay

## 2021-01-02 MED ORDER — ACCU-CHEK SOFTCLIX LANCETS MISC: 1.0000 | Freq: Four times a day (QID) | 3 refills | Status: DC

## 2021-01-02 MED ORDER — ACCU-CHEK AVIVA PLUS VI STRP: ORAL_STRIP | 3 refills | Status: DC

## 2021-01-02 NOTE — Progress Notes (Signed)
Patient states that she for insurance purposes needs new script for lancets #150 and test strips #360 in order for them to cover scripts. Armandina Stammer RN

## 2021-01-03 ENCOUNTER — Ambulatory Visit: Payer: 59 | Admitting: *Deleted

## 2021-01-03 ENCOUNTER — Ambulatory Visit (INDEPENDENT_AMBULATORY_CARE_PROVIDER_SITE_OTHER): Payer: 59

## 2021-01-03 ENCOUNTER — Other Ambulatory Visit: Payer: Self-pay

## 2021-01-03 VITALS — BP 121/67 | HR 85

## 2021-01-03 DIAGNOSIS — O24415 Gestational diabetes mellitus in pregnancy, controlled by oral hypoglycemic drugs: Secondary | ICD-10-CM

## 2021-01-03 NOTE — Progress Notes (Signed)

## 2021-01-04 ENCOUNTER — Ambulatory Visit (INDEPENDENT_AMBULATORY_CARE_PROVIDER_SITE_OTHER): Payer: 59 | Admitting: Family Medicine

## 2021-01-04 VITALS — BP 110/65 | HR 108 | Wt 186.0 lb

## 2021-01-04 DIAGNOSIS — O98319 Other infections with a predominantly sexual mode of transmission complicating pregnancy, unspecified trimester: Secondary | ICD-10-CM

## 2021-01-04 DIAGNOSIS — O09899 Supervision of other high risk pregnancies, unspecified trimester: Secondary | ICD-10-CM

## 2021-01-04 DIAGNOSIS — Z2839 Other underimmunization status: Secondary | ICD-10-CM

## 2021-01-04 DIAGNOSIS — A6009 Herpesviral infection of other urogenital tract: Secondary | ICD-10-CM

## 2021-01-04 DIAGNOSIS — O0990 Supervision of high risk pregnancy, unspecified, unspecified trimester: Secondary | ICD-10-CM

## 2021-01-04 DIAGNOSIS — O24415 Gestational diabetes mellitus in pregnancy, controlled by oral hypoglycemic drugs: Secondary | ICD-10-CM

## 2021-01-05 MED ORDER — VALACYCLOVIR HCL 500 MG PO TABS
500.0000 mg | ORAL_TABLET | Freq: Two times a day (BID) | ORAL | 5 refills | Status: DC
Start: 2021-01-05 — End: 2022-10-04

## 2021-01-05 NOTE — Progress Notes (Signed)
   PRENATAL VISIT NOTE  Subjective:  Tonya Douglas is a 35 y.o. G2P1001 at [redacted]w[redacted]d being seen today for ongoing prenatal care.  She is currently monitored for the following issues for this high-risk pregnancy and has Gestational diabetes mellitus (GDM) in third trimester; Supervision of high risk pregnancy, antepartum; History of gestational diabetes mellitus (GDM); Rubella non-immune status, antepartum; and Genital herpes affecting pregnancy, antepartum on their problem list.  Patient reports  still has cough, but feels like it's improving .  Contractions: Not present. Vag. Bleeding: None.  Movement: Present. Denies leaking of fluid.   The following portions of the patient's history were reviewed and updated as appropriate: allergies, current medications, past family history, past medical history, past social history, past surgical history and problem list.   Objective:   Vitals:   01/04/21 1021  BP: 110/65  Pulse: (!) 108  Weight: 186 lb (84.4 kg)    Fetal Status: Fetal Heart Rate (bpm): 154   Movement: Present     General:  Alert, oriented and cooperative. Patient is in no acute distress.  Skin: Skin is warm and dry. No rash noted.   Cardiovascular: Normal heart rate noted  Respiratory: Normal respiratory effort, no problems with respiration noted  Abdomen: Soft, gravid, appropriate for gestational age.  Pain/Pressure: Absent     Pelvic: Cervical exam deferred        Extremities: Normal range of motion.  Edema: None  Mental Status: Normal mood and affect. Normal behavior. Normal judgment and thought content.   Assessment and Plan:  Pregnancy: G2P1001 at [redacted]w[redacted]d 1. Supervision of high risk pregnancy, antepartum FHT and FH normal  2. Gestational diabetes mellitus (GDM) in third trimester controlled on oral hypoglycemic drug Ran out of test strips and had difficulty refilling due to insurance.  Starting testing again last night - blood sugar normal.  No side effects to the  metformin.  3. Genital herpes affecting pregnancy, antepartum Start suppression next week.  4. Rubella non-immune status, antepartum   Preterm labor symptoms and general obstetric precautions including but not limited to vaginal bleeding, contractions, leaking of fluid and fetal movement were reviewed in detail with the patient. Please refer to After Visit Summary for other counseling recommendations.   No follow-ups on file.  Future Appointments  Date Time Provider Department Center  01/09/2021  9:15 AM [facility] NST [facility]  01/18/2021  8:35 AM Levie Heritage, DO [facility] None  01/18/2021 12:30 PM [facility] NURSE [facility]  01/18/2021 12:45 PM [facility] US4 [facility]  01/25/2021  8:15 AM Levie Heritage, DO [facility] None  01/26/2021  2:15 PM [facility] NST [facility]  02/01/2021  9:15 AM Levie Heritage, DO [facility] None  02/01/2021  1:15 PM [facility] NST [facility]    Levie Heritage, DO

## 2021-01-05 NOTE — Addendum Note (Signed)
Addended by: Levie Heritage on: 01/05/2021 08:12 AM   Modules accepted: Orders

## 2021-01-09 ENCOUNTER — Other Ambulatory Visit: Payer: Self-pay

## 2021-01-09 ENCOUNTER — Ambulatory Visit (INDEPENDENT_AMBULATORY_CARE_PROVIDER_SITE_OTHER): Payer: 59

## 2021-01-09 ENCOUNTER — Ambulatory Visit: Payer: 59 | Admitting: *Deleted

## 2021-01-09 VITALS — BP 113/60 | HR 90

## 2021-01-09 DIAGNOSIS — O24415 Gestational diabetes mellitus in pregnancy, controlled by oral hypoglycemic drugs: Secondary | ICD-10-CM

## 2021-01-09 NOTE — Progress Notes (Signed)

## 2021-01-10 NOTE — Progress Notes (Signed)
Chart reviewed - agree with CMA/RN documentation.  ° °

## 2021-01-13 ENCOUNTER — Encounter: Payer: Self-pay | Admitting: Family Medicine

## 2021-01-16 ENCOUNTER — Encounter: Payer: Self-pay | Admitting: General Practice

## 2021-01-16 ENCOUNTER — Other Ambulatory Visit: Payer: 59

## 2021-01-18 ENCOUNTER — Other Ambulatory Visit: Payer: Self-pay

## 2021-01-18 ENCOUNTER — Encounter: Payer: Self-pay | Admitting: *Deleted

## 2021-01-18 ENCOUNTER — Ambulatory Visit (HOSPITAL_BASED_OUTPATIENT_CLINIC_OR_DEPARTMENT_OTHER): Payer: 59

## 2021-01-18 ENCOUNTER — Ambulatory Visit: Payer: 59 | Admitting: *Deleted

## 2021-01-18 ENCOUNTER — Ambulatory Visit (INDEPENDENT_AMBULATORY_CARE_PROVIDER_SITE_OTHER): Payer: 59 | Admitting: Family Medicine

## 2021-01-18 ENCOUNTER — Other Ambulatory Visit
Admission: RE | Admit: 2021-01-18 | Discharge: 2021-01-18 | Disposition: A | Discharge status: Home / Self Care | Payer: 59 | Source: Ambulatory Visit | Attending: Family Medicine | Admitting: Family Medicine

## 2021-01-18 VITALS — BP 126/71 | HR 78 | Wt 185.0 lb

## 2021-01-18 VITALS — BP 122/47 | HR 77

## 2021-01-18 DIAGNOSIS — O24419 Gestational diabetes mellitus in pregnancy, unspecified control: Secondary | ICD-10-CM | POA: Diagnosis present

## 2021-01-18 DIAGNOSIS — O09899 Supervision of other high risk pregnancies, unspecified trimester: Secondary | ICD-10-CM | POA: Insufficient documentation

## 2021-01-18 DIAGNOSIS — Z2839 Other underimmunization status: Secondary | ICD-10-CM

## 2021-01-18 DIAGNOSIS — Z3A36 36 weeks gestation of pregnancy: Secondary | ICD-10-CM

## 2021-01-18 DIAGNOSIS — O24415 Gestational diabetes mellitus in pregnancy, controlled by oral hypoglycemic drugs: Secondary | ICD-10-CM

## 2021-01-18 DIAGNOSIS — O98319 Other infections with a predominantly sexual mode of transmission complicating pregnancy, unspecified trimester: Secondary | ICD-10-CM

## 2021-01-18 DIAGNOSIS — O0990 Supervision of high risk pregnancy, unspecified, unspecified trimester: Secondary | ICD-10-CM

## 2021-01-18 DIAGNOSIS — O43193 Other malformation of placenta, third trimester: Secondary | ICD-10-CM

## 2021-01-18 DIAGNOSIS — A6009 Herpesviral infection of other urogenital tract: Secondary | ICD-10-CM

## 2021-01-18 DIAGNOSIS — E669 Obesity, unspecified: Secondary | ICD-10-CM

## 2021-01-18 DIAGNOSIS — O99213 Obesity complicating pregnancy, third trimester: Secondary | ICD-10-CM

## 2021-01-18 NOTE — Progress Notes (Signed)
Subjective:  Tonya Douglas is a 35 y.o. G2P1001 at 76w3dbeing seen today for ongoing prenatal care.  She is currently monitored for the following issues for this high-risk pregnancy and has Gestational diabetes mellitus (GDM) in third trimester; Supervision of high risk pregnancy, antepartum; History of gestational diabetes mellitus (GDM); Rubella non-immune status, antepartum; and Genital herpes affecting pregnancy, antepartum on their problem list.  GDM: Patient taking Metformin 5054mqhs.  Reports no hypoglycemic episodes.  Tolerating medication well Fasting: most fastings are above goal 2hr PP: less than 30% of 2hr PP are above goal, but above goal numbers tend to be after lunch time. Pt notes eating pizza at lunch or having a sugary snack on some occasions.   Patient reports that her cough and congestion improved significantly after starting allergy medicine. Reports some intermittent contractions.. Vag. Bleeding: None.  Movement: Present. Denies leaking of fluid.   The following portions of the patient's history were reviewed and updated as appropriate: allergies, current medications, past family history, past medical history, past social history, past surgical history and problem list. Problem list updated.  Objective:   Vitals:   01/18/21 0845  BP: 126/71  Pulse: 78  Weight: 185 lb (83.9 kg)    Fetal Status: Fetal Heart Rate (bpm): 136   Movement: Present     General:  Alert, oriented and cooperative. Patient is in no acute distress.  Skin: Skin is warm and dry. No rash noted.   Cardiovascular: Normal heart rate noted  Respiratory: Normal respiratory effort, no problems with respiration noted  Abdomen: Soft, gravid, appropriate for gestational age. Pain/Pressure: Present     Pelvic: Vag. Bleeding: None     Cervical exam performed      Pt is 1.5cm dilated  Extremities: Normal range of motion.  Edema: None  Mental Status: Normal mood and affect. Normal behavior. Normal judgment  and thought content.   Urinalysis:      Assessment and Plan:  Pregnancy: G2P1001 at 36106w3d. [redacted] weeks gestation of pregnancy FHR and FH normal - Culture, beta strep (group b only) - GC[facility]lamydia [facility]. Gestational diabetes mellitus (GDM) in third trimester controlled on oral hypoglycemic drug -Will increase from Metformin 500m24ms to 1000mg14m due to frequently elevated fastings. -Discussed reflective consideration and adjustment of lunch meal choices given elevations more after lunch than other meals.  3.  Genital herpes affecting pregnancy, antepartum -Discussed recommendation for pt to take Valtrex 500mg 77m 4.  Non-immune rubella status, antepartum -MMR after delivery  Preterm labor symptoms and general obstetric precautions including but not limited to vaginal bleeding, contractions, leaking of fluid and fetal movement were reviewed in detail with the patient. Please refer to After Visit Summary for other counseling recommendations.  No follow-ups on file.   Eldora Napp, [place] [employer] Student

## 2021-01-19 LAB — GC/CHLAMYDIA PROBE AMP (~~LOC~~) NOT AT ARMC
Chlamydia: NEGATIVE
Comment: NEGATIVE
Comment: NORMAL
Neisseria Gonorrhea: NEGATIVE

## 2021-01-19 NOTE — Progress Notes (Signed)
° °  PRENATAL VISIT NOTE  Subjective:  Tonya Douglas is a 35 y.o. G2P1001 at 75w4dbeing seen today for ongoing prenatal care.  She is currently monitored for the following issues for this high-risk pregnancy and has Gestational diabetes mellitus (GDM) in third trimester; Supervision of high risk pregnancy, antepartum; History of gestational diabetes mellitus (GDM); Rubella non-immune status, antepartum; and Genital herpes affecting pregnancy, antepartum on their problem list.  Patient reports no complaints.  Contractions: Irritability. Vag. Bleeding: None.  Movement: Present. Denies leaking of fluid.   The following portions of the patient's history were reviewed and updated as appropriate: allergies, current medications, past family history, past medical history, past social history, past surgical history and problem list.   Objective:   Vitals:   01/18/21 0845  BP: 126/71  Pulse: 78  Weight: 185 lb (83.9 kg)    Fetal Status: Fetal Heart Rate (bpm): 136   Movement: Present  Presentation: Vertex  General:  Alert, oriented and cooperative. Patient is in no acute distress.  Skin: Skin is warm and dry. No rash noted.   Cardiovascular: Normal heart rate noted  Respiratory: Normal respiratory effort, no problems with respiration noted  Abdomen: Soft, gravid, appropriate for gestational age.  Pain/Pressure: Present     Pelvic: Cervical exam deferred Dilation: 1.5 Effacement (%): 50 Station: -3  Extremities: Normal range of motion.  Edema: None  Mental Status: Normal mood and affect. Normal behavior. Normal judgment and thought content.   Assessment and Plan:  Pregnancy: G2P1001 at 364w4d. [redacted] weeks gestation of pregnancy FHT and FH normal - Culture, beta strep (group b only) - [facility]lam[facility]. Supervision of high risk pregnancy, antepartum FHT and FH normal  3. Gestational diabetes mellitus (GDM) in third trimester controlled on oral hypoglycemic  drug Fastings elevated - increase metformin to 100027mhs. Many lunch values elevated - recommended better food choices. - Culture, beta strep (group b only) - [facility] amp ()[facility]. Genital herpes affecting pregnancy, antepartum Start valtrex BID  5. Rubella non-immune status, antepartum MMR post delivery   Preterm labor symptoms and general obstetric precautions including but not limited to vaginal bleeding, contractions, leaking of fluid and fetal movement were reviewed in detail with the patient. Please refer to After Visit Summary for other counseling recommendations.   No follow-ups on file.  Future Appointments  Date Time Provider DepRoxton2/21/2022  8:15 AM StiTruett MainlandO [facility] None  01/26/2021  2:15 PM [facility]2/28/2022  9:15 AM StiTruett MainlandO [facility] None  02/01/2021  1:15 PM [facility]

## 2021-01-21 ENCOUNTER — Encounter: Payer: Self-pay | Admitting: Family Medicine

## 2021-01-21 DIAGNOSIS — O99820 Streptococcus B carrier state complicating pregnancy: Secondary | ICD-10-CM | POA: Insufficient documentation

## 2021-01-21 LAB — CULTURE, BETA STREP (GROUP B ONLY): Strep Gp B Culture: POSITIVE — AB

## 2021-01-25 ENCOUNTER — Encounter: Payer: Self-pay | Admitting: Family Medicine

## 2021-01-25 ENCOUNTER — Ambulatory Visit (INDEPENDENT_AMBULATORY_CARE_PROVIDER_SITE_OTHER): Payer: 59 | Admitting: Family Medicine

## 2021-01-25 ENCOUNTER — Other Ambulatory Visit: Payer: Self-pay

## 2021-01-25 VITALS — BP 111/62 | HR 78 | Wt 185.0 lb

## 2021-01-25 DIAGNOSIS — R399 Unspecified symptoms and signs involving the genitourinary system: Secondary | ICD-10-CM

## 2021-01-25 DIAGNOSIS — Z3A37 37 weeks gestation of pregnancy: Secondary | ICD-10-CM

## 2021-01-25 DIAGNOSIS — O99820 Streptococcus B carrier state complicating pregnancy: Secondary | ICD-10-CM

## 2021-01-25 DIAGNOSIS — A6009 Herpesviral infection of other urogenital tract: Secondary | ICD-10-CM

## 2021-01-25 DIAGNOSIS — O0990 Supervision of high risk pregnancy, unspecified, unspecified trimester: Secondary | ICD-10-CM

## 2021-01-25 DIAGNOSIS — O98319 Other infections with a predominantly sexual mode of transmission complicating pregnancy, unspecified trimester: Secondary | ICD-10-CM

## 2021-01-25 DIAGNOSIS — R319 Hematuria, unspecified: Secondary | ICD-10-CM

## 2021-01-25 DIAGNOSIS — O24415 Gestational diabetes mellitus in pregnancy, controlled by oral hypoglycemic drugs: Secondary | ICD-10-CM

## 2021-01-25 LAB — POCT URINALYSIS DIPSTICK OB
Bilirubin, UA: NEGATIVE
Glucose, UA: NEGATIVE
Ketones, UA: NEGATIVE
Nitrite, UA: NEGATIVE
POC,PROTEIN,UA: NEGATIVE
Spec Grav, UA: 1.02 (ref 1.010–1.025)
Urobilinogen, UA: 0.2 E.U./dL
pH, UA: 5 (ref 5.0–8.0)

## 2021-01-25 NOTE — Addendum Note (Signed)
Addended by: Mikey Bussing on: 01/25/2021 08:55 AM   Modules accepted: Orders

## 2021-01-25 NOTE — Progress Notes (Signed)
° °  PRENATAL VISIT NOTE  Subjective:  Tonya Douglas is a 35 y.o. G2P1001 at [redacted]w[redacted]d being seen today for ongoing prenatal care.  She is currently monitored for the following issues for this high-risk pregnancy and has Gestational diabetes mellitus (GDM) in third trimester; Supervision of high risk pregnancy, antepartum; History of gestational diabetes mellitus (GDM); Rubella non-immune status, antepartum; Genital herpes affecting pregnancy, antepartum; and GBS (group B Streptococcus carrier), +RV culture, currently pregnant on their problem list.  Patient reports no complaints.  Contractions: Not present. Vag. Bleeding: None.  Movement: Present. Denies leaking of fluid.   The following portions of the patient's history were reviewed and updated as appropriate: allergies, current medications, past family history, past medical history, past social history, past surgical history and problem list.   Objective:   Vitals:   01/25/21 0819  BP: 111/62  Pulse: 78  Weight: 185 lb (83.9 kg)    Fetal Status: Fetal Heart Rate (bpm): 152 Fundal Height: 37 cm Movement: Present     General:  Alert, oriented and cooperative. Patient is in no acute distress.  Skin: Skin is warm and dry. No rash noted.   Cardiovascular: Normal heart rate noted  Respiratory: Normal respiratory effort, no problems with respiration noted  Abdomen: Soft, gravid, appropriate for gestational age.  Pain/Pressure: Present     Pelvic: Cervical exam deferred        Extremities: Normal range of motion.  Edema: None  Mental Status: Normal mood and affect. Normal behavior. Normal judgment and thought content.   Assessment and Plan:  Pregnancy: G2P1001 at [redacted]w[redacted]d 1. [redacted] weeks gestation of pregnancy - POC Urinalysis Dipstick OB  2. Supervision of high risk pregnancy, antepartum FHT and FH normal  3. Gestational diabetes mellitus (GDM) in third trimester controlled on oral hypoglycemic drug Induction at 39 weeks Antenatal testing  normal Controlled on metformin - taking 1000mg  one night and 500mg  the next.  4. GBS (group B Streptococcus carrier), +RV culture, currently pregnant Intrapartum abx  5. Genital herpes affecting pregnancy, antepartum On valtrex.  Term labor symptoms and general obstetric precautions including but not limited to vaginal bleeding, contractions, leaking of fluid and fetal movement were reviewed in detail with the patient. Please refer to After Visit Summary for other counseling recommendations.   No follow-ups on file.  Future Appointments  Date Time Provider Department Center  01/26/2021  2:15 PM [facility] NST [facility]  02/01/2021  9:15 AM [facility] None  02/01/2021  1:15 PM [facility]    [facility], DO

## 2021-01-26 ENCOUNTER — Ambulatory Visit (INDEPENDENT_AMBULATORY_CARE_PROVIDER_SITE_OTHER): Payer: 59

## 2021-01-26 ENCOUNTER — Ambulatory Visit: Payer: 59 | Admitting: *Deleted

## 2021-01-26 DIAGNOSIS — O24415 Gestational diabetes mellitus in pregnancy, controlled by oral hypoglycemic drugs: Secondary | ICD-10-CM

## 2021-01-26 NOTE — Progress Notes (Signed)

## 2021-01-27 LAB — CULTURE, OB URINE

## 2021-01-27 LAB — URINE CULTURE, OB REFLEX

## 2021-01-30 ENCOUNTER — Telehealth (HOSPITAL_COMMUNITY): Payer: Self-pay | Admitting: *Deleted

## 2021-01-30 ENCOUNTER — Encounter (HOSPITAL_COMMUNITY): Payer: Self-pay | Admitting: *Deleted

## 2021-01-30 NOTE — Telephone Encounter (Signed)
Preadmission screen  

## 2021-02-01 ENCOUNTER — Encounter: Payer: 59 | Admitting: Family Medicine

## 2021-02-01 ENCOUNTER — Other Ambulatory Visit: Payer: Self-pay

## 2021-02-01 ENCOUNTER — Ambulatory Visit: Payer: 59 | Admitting: *Deleted

## 2021-02-01 ENCOUNTER — Ambulatory Visit (INDEPENDENT_AMBULATORY_CARE_PROVIDER_SITE_OTHER): Payer: 59 | Admitting: Family Medicine

## 2021-02-01 ENCOUNTER — Ambulatory Visit (INDEPENDENT_AMBULATORY_CARE_PROVIDER_SITE_OTHER): Payer: 59

## 2021-02-01 VITALS — BP 106/64 | HR 79 | Wt 187.0 lb

## 2021-02-01 DIAGNOSIS — O99820 Streptococcus B carrier state complicating pregnancy: Secondary | ICD-10-CM

## 2021-02-01 DIAGNOSIS — Z2839 Other underimmunization status: Secondary | ICD-10-CM

## 2021-02-01 DIAGNOSIS — O09899 Supervision of other high risk pregnancies, unspecified trimester: Secondary | ICD-10-CM

## 2021-02-01 DIAGNOSIS — O24415 Gestational diabetes mellitus in pregnancy, controlled by oral hypoglycemic drugs: Secondary | ICD-10-CM

## 2021-02-01 DIAGNOSIS — A6009 Herpesviral infection of other urogenital tract: Secondary | ICD-10-CM

## 2021-02-01 DIAGNOSIS — O0990 Supervision of high risk pregnancy, unspecified, unspecified trimester: Secondary | ICD-10-CM

## 2021-02-01 DIAGNOSIS — O98319 Other infections with a predominantly sexual mode of transmission complicating pregnancy, unspecified trimester: Secondary | ICD-10-CM

## 2021-02-01 NOTE — Progress Notes (Signed)
° °  PRENATAL VISIT NOTE  Subjective:  Tonya Douglas is a 35 y.o. G2P1001 at [redacted]w[redacted]d being seen today for ongoing prenatal care.  She is currently monitored for the following issues for this high-risk pregnancy and has Gestational diabetes mellitus (GDM) in third trimester; Supervision of high risk pregnancy, antepartum; History of gestational diabetes mellitus (GDM); Rubella non-immune status, antepartum; Genital herpes affecting pregnancy, antepartum; and GBS (group B Streptococcus carrier), +RV culture, currently pregnant on their problem list.  Patient reports no complaints.  Contractions: Not present. Vag. Bleeding: None.  Movement: Present. Denies leaking of fluid.   The following portions of the patient's history were reviewed and updated as appropriate: allergies, current medications, past family history, past medical history, past social history, past surgical history and problem list.   Objective:   Vitals:   02/01/21 0922  BP: 106/64  Pulse: 79  Weight: 187 lb (84.8 kg)    Fetal Status: Fetal Heart Rate (bpm): 142   Movement: Present     General:  Alert, oriented and cooperative. Patient is in no acute distress.  Skin: Skin is warm and dry. No rash noted.   Cardiovascular: Normal heart rate noted  Respiratory: Normal respiratory effort, no problems with respiration noted  Abdomen: Soft, gravid, appropriate for gestational age.  Pain/Pressure: Present     Pelvic: Cervical exam deferred        Extremities: Normal range of motion.  Edema: None  Mental Status: Normal mood and affect. Normal behavior. Normal judgment and thought content.   Assessment and Plan:  Pregnancy: G2P1001 at [redacted]w[redacted]d 1. Supervision of high risk pregnancy, antepartum FHT and FH normal  2. GBS (group B Streptococcus carrier), +RV culture, currently pregnant PPx during labor  3. Gestational diabetes mellitus (GDM) in third trimester controlled on oral hypoglycemic drug Induce on 1/3.  Controlled on  metformin BPP today  4. Rubella non-immune status, antepartum MMR post delivery  5. Genital herpes affecting pregnancy, antepartum On suppression  Term labor symptoms and general obstetric precautions including but not limited to vaginal bleeding, contractions, leaking of fluid and fetal movement were reviewed in detail with the patient. Please refer to After Visit Summary for other counseling recommendations.   Return in about 5 weeks (around 03/08/2021) for Postpartum Exam.  Future Appointments  Date Time Provider [place]  02/01/2021  1:15 PM [facility] NST [facility]  02/07/2021 12:00 AM [facility] [place] None    Truett Mainland, DO

## 2021-02-01 NOTE — Progress Notes (Signed)
Pt has appt for NST/AFI this afternoon

## 2021-02-02 NOTE — Progress Notes (Signed)
Chart reviewed - agree with CMA/RN documentation.  ° °

## 2021-02-05 NOTE — L&D Delivery Note (Signed)
Delivery Note Tonya Douglas is a 36 y.o. G2P1001 at [redacted]w[redacted]d admitted for IOL d/t A2GDM.   GBS Status: Positive/-- (12/14 0916) Maximum Maternal Temperature: 98.2  Labor course: Initial SVE: 2/60/-2. Augmentation with: Pitocin, Cytotec, and IP Foley. She then progressed to complete.  ROM: rupture date, rupture time, delivery date, or delivery time have not been documented with meconium-stained fluid  Birth: At 1412 a viable female was delivered via spontaneous vaginal delivery (Presentation:cephalic;ROA). Nuchal cord present: Yes.  Shoulders and body delivered in usual fashion. Infant placed directly on mom's abdomen for bonding/skin-to-skin, baby dried and stimulated. Cord clamped x 2 after 1 minute and cut by FOB.  Cord blood collected.  The placenta separated spontaneously and delivered via gentle cord traction.  Pitocin infused rapidly IV per protocol.  Fundus firm with massage.  Placenta inspected and appears to be intact with a 3 VC.  Placenta/Cord with the following complications: none.  Cord pH: n/a Sponge and instrument count were correct x2.  Intrapartum complications:  None Anesthesia:  none Episiotomy: none Lacerations:  bilateral labial abrasions, hemostatic, no repaired Suture Repair:  n/a EBL (mL): 125   Infant: APGAR (1 MIN): 8   APGAR (5 MINS): 9   Infant weight: pending  Mom to postpartum.  Baby to Couplet care / Skin to Skin. Placenta to L&D   Plans to Breastfeed Contraception:  unsure Circumcision: N/A  Note sent to [facility]: HP for pp visit.  Brand Males CNM 02/07/2021 2:31 PM

## 2021-02-06 ENCOUNTER — Other Ambulatory Visit (HOSPITAL_COMMUNITY): Payer: Self-pay | Admitting: *Deleted

## 2021-02-07 ENCOUNTER — Encounter (HOSPITAL_COMMUNITY): Payer: Self-pay | Admitting: Obstetrics & Gynecology

## 2021-02-07 ENCOUNTER — Inpatient Hospital Stay (HOSPITAL_COMMUNITY): Payer: 59

## 2021-02-07 ENCOUNTER — Inpatient Hospital Stay (HOSPITAL_COMMUNITY)
Admission: AD | Admit: 2021-02-07 | Discharge: 2021-02-08 | DRG: 806 | Disposition: A | Discharge status: Home / Self Care | Payer: 59 | Attending: Obstetrics and Gynecology | Admitting: Obstetrics and Gynecology

## 2021-02-07 DIAGNOSIS — O24425 Gestational diabetes mellitus in childbirth, controlled by oral hypoglycemic drugs: Secondary | ICD-10-CM | POA: Diagnosis present

## 2021-02-07 DIAGNOSIS — Z23 Encounter for immunization: Secondary | ICD-10-CM

## 2021-02-07 DIAGNOSIS — O9832 Other infections with a predominantly sexual mode of transmission complicating childbirth: Secondary | ICD-10-CM | POA: Diagnosis present

## 2021-02-07 DIAGNOSIS — A6000 Herpesviral infection of urogenital system, unspecified: Secondary | ICD-10-CM | POA: Diagnosis present

## 2021-02-07 DIAGNOSIS — O99824 Streptococcus B carrier state complicating childbirth: Secondary | ICD-10-CM | POA: Diagnosis present

## 2021-02-07 DIAGNOSIS — Z3A39 39 weeks gestation of pregnancy: Secondary | ICD-10-CM

## 2021-02-07 DIAGNOSIS — O24419 Gestational diabetes mellitus in pregnancy, unspecified control: Secondary | ICD-10-CM

## 2021-02-07 DIAGNOSIS — Z20822 Contact with and (suspected) exposure to covid-19: Secondary | ICD-10-CM | POA: Diagnosis present

## 2021-02-07 LAB — CBC
HCT: 34.4 % — ABNORMAL LOW (ref 36.0–46.0)
Hemoglobin: 11.7 g/dL — ABNORMAL LOW (ref 12.0–15.0)
MCH: 29.8 pg (ref 26.0–34.0)
MCHC: 34 g/dL (ref 30.0–36.0)
MCV: 87.8 fL (ref 80.0–100.0)
Platelets: 211 10*3/uL (ref 150–400)
RBC: 3.92 MIL/uL (ref 3.87–5.11)
RDW: 16 % — ABNORMAL HIGH (ref 11.5–15.5)
WBC: 9.3 10*3/uL (ref 4.0–10.5)
nRBC: 0 % (ref 0.0–0.2)

## 2021-02-07 LAB — RESP PANEL BY RT-PCR (FLU A&B, COVID) ARPGX2
Influenza A by PCR: NEGATIVE
Influenza B by PCR: NEGATIVE
SARS Coronavirus 2 by RT PCR: NEGATIVE

## 2021-02-07 LAB — TYPE AND SCREEN
ABO/RH(D): O POS
Antibody Screen: NEGATIVE

## 2021-02-07 LAB — GLUCOSE, CAPILLARY
Glucose-Capillary: 113 mg/dL — ABNORMAL HIGH (ref 70–99)
Glucose-Capillary: 79 mg/dL (ref 70–99)
Glucose-Capillary: 99 mg/dL (ref 70–99)

## 2021-02-07 LAB — RPR: RPR Ser Ql: NONREACTIVE

## 2021-02-07 MED ORDER — FENTANYL CITRATE (PF) 100 MCG/2ML IJ SOLN
50.0000 ug | INTRAMUSCULAR | Status: DC | PRN
  Administered 2021-02-07 (×2): 100 ug via INTRAVENOUS
  Filled 2021-02-07 (×2): qty 2

## 2021-02-07 MED ORDER — PENICILLIN G POT IN DEXTROSE 60000 UNIT/ML IV SOLN
3.0000 10*6.[IU] | INTRAVENOUS | Status: DC
  Administered 2021-02-07 (×2): 3 10*6.[IU] via INTRAVENOUS
  Filled 2021-02-07 (×2): qty 50

## 2021-02-07 MED ORDER — LACTATED RINGERS IV SOLN: INTRAVENOUS | Status: DC

## 2021-02-07 MED ORDER — SENNOSIDES-DOCUSATE SODIUM 8.6-50 MG PO TABS
2.0000 | ORAL_TABLET | Freq: Every day | ORAL | Status: DC
  Administered 2021-02-08: 2 via ORAL
  Filled 2021-02-07: qty 2

## 2021-02-07 MED ORDER — ONDANSETRON HCL 4 MG/2ML IJ SOLN: 4.0000 mg | INTRAMUSCULAR | Status: DC | PRN

## 2021-02-07 MED ORDER — COCONUT OIL OIL: 1.0000 "application " | TOPICAL_OIL | Status: DC | PRN

## 2021-02-07 MED ORDER — ONDANSETRON HCL 4 MG/2ML IJ SOLN
4.0000 mg | Freq: Four times a day (QID) | INTRAMUSCULAR | Status: DC | PRN
  Administered 2021-02-07: 4 mg via INTRAVENOUS
  Filled 2021-02-07: qty 2

## 2021-02-07 MED ORDER — DIPHENHYDRAMINE HCL 25 MG PO CAPS: 25.0000 mg | ORAL_CAPSULE | Freq: Four times a day (QID) | ORAL | Status: DC | PRN

## 2021-02-07 MED ORDER — OXYCODONE-ACETAMINOPHEN 5-325 MG PO TABS: 2.0000 | ORAL_TABLET | ORAL | Status: DC | PRN

## 2021-02-07 MED ORDER — ONDANSETRON HCL 4 MG PO TABS: 4.0000 mg | ORAL_TABLET | ORAL | Status: DC | PRN

## 2021-02-07 MED ORDER — BENZOCAINE-MENTHOL 20-0.5 % EX AERO
1.0000 "application " | INHALATION_SPRAY | CUTANEOUS | Status: DC | PRN
  Administered 2021-02-07: 1 via TOPICAL
  Filled 2021-02-07: qty 56

## 2021-02-07 MED ORDER — PRENATAL MULTIVITAMIN CH
1.0000 | ORAL_TABLET | Freq: Every day | ORAL | Status: DC
  Administered 2021-02-08: 1 via ORAL
  Filled 2021-02-07: qty 1

## 2021-02-07 MED ORDER — TERBUTALINE SULFATE 1 MG/ML IJ SOLN: 0.2500 mg | Freq: Once | INTRAMUSCULAR | Status: DC | PRN

## 2021-02-07 MED ORDER — ACETAMINOPHEN 325 MG PO TABS: 650.0000 mg | ORAL_TABLET | ORAL | Status: DC | PRN

## 2021-02-07 MED ORDER — SIMETHICONE 80 MG PO CHEW: 80.0000 mg | CHEWABLE_TABLET | ORAL | Status: DC | PRN

## 2021-02-07 MED ORDER — IBUPROFEN 600 MG PO TABS
600.0000 mg | ORAL_TABLET | Freq: Four times a day (QID) | ORAL | Status: DC
  Administered 2021-02-07 – 2021-02-08 (×5): 600 mg via ORAL
  Filled 2021-02-07 (×5): qty 1

## 2021-02-07 MED ORDER — LACTATED RINGERS IV SOLN: 500.0000 mL | INTRAVENOUS | Status: DC | PRN

## 2021-02-07 MED ORDER — TETANUS-DIPHTH-ACELL PERTUSSIS 5-2.5-18.5 LF-MCG/0.5 IM SUSY: 0.5000 mL | PREFILLED_SYRINGE | Freq: Once | INTRAMUSCULAR | Status: DC

## 2021-02-07 MED ORDER — LIDOCAINE HCL (PF) 1 % IJ SOLN: 30.0000 mL | INTRAMUSCULAR | Status: DC | PRN

## 2021-02-07 MED ORDER — OXYTOCIN BOLUS FROM INFUSION: 333.0000 mL | Freq: Once | INTRAVENOUS | Status: DC

## 2021-02-07 MED ORDER — SODIUM CHLORIDE 0.9 % IV SOLN
5.0000 10*6.[IU] | Freq: Once | INTRAVENOUS | Status: AC
  Administered 2021-02-07: 5 10*6.[IU] via INTRAVENOUS
  Filled 2021-02-07: qty 5

## 2021-02-07 MED ORDER — OXYTOCIN-SODIUM CHLORIDE 30-0.9 UT/500ML-% IV SOLN: 2.5000 [IU]/h | INTRAVENOUS | Status: DC

## 2021-02-07 MED ORDER — DIBUCAINE (PERIANAL) 1 % EX OINT: 1.0000 "application " | TOPICAL_OINTMENT | CUTANEOUS | Status: DC | PRN

## 2021-02-07 MED ORDER — OXYCODONE-ACETAMINOPHEN 5-325 MG PO TABS: 1.0000 | ORAL_TABLET | ORAL | Status: DC | PRN

## 2021-02-07 MED ORDER — MISOPROSTOL 25 MCG QUARTER TABLET
25.0000 ug | ORAL_TABLET | ORAL | Status: DC | PRN
  Administered 2021-02-07 (×2): 25 ug via VAGINAL
  Filled 2021-02-07 (×2): qty 1

## 2021-02-07 MED ORDER — OXYTOCIN-SODIUM CHLORIDE 30-0.9 UT/500ML-% IV SOLN
1.0000 m[IU]/min | INTRAVENOUS | Status: DC
  Administered 2021-02-07: 4 m[IU]/min via INTRAVENOUS
  Administered 2021-02-07: 2 m[IU]/min via INTRAVENOUS
  Filled 2021-02-07: qty 500

## 2021-02-07 MED ORDER — WITCH HAZEL-GLYCERIN EX PADS: 1.0000 "application " | MEDICATED_PAD | CUTANEOUS | Status: DC | PRN

## 2021-02-07 MED ORDER — ZOLPIDEM TARTRATE 5 MG PO TABS: 5.0000 mg | ORAL_TABLET | Freq: Every evening | ORAL | Status: DC | PRN

## 2021-02-07 MED ORDER — SOD CITRATE-CITRIC ACID 500-334 MG/5ML PO SOLN: 30.0000 mL | ORAL | Status: DC | PRN

## 2021-02-07 MED ORDER — MEASLES, MUMPS & RUBELLA VAC IJ SOLR
0.5000 mL | Freq: Once | INTRAMUSCULAR | Status: AC
  Administered 2021-02-08: 0.5 mL via SUBCUTANEOUS
  Filled 2021-02-07: qty 0.5

## 2021-02-07 NOTE — Progress Notes (Addendum)
Labor Progress Note Meekah Math is a 36 y.o. G2P1001 at [redacted]w[redacted]d presented for IOL due to A2GDM S: Doing well without concerns.  O:  BP (!) 109/49    Pulse 86    Temp 98 F (36.7 C)    Resp 16    LMP 05/08/2020  EFM: 140/moderate/+accels  CVE: Dilation: 5 Effacement (%): 60 Station: -3 Presentation: Vertex Exam by:: Jonelle Sidle, RN   A&P: 36 y.o. G2P1001 [redacted]w[redacted]d  #Labor: Progressing well. S/p FB and 2x cytotec. On Pit. #Pain: PRN #FWB: Cat 1 #GBS positive on PCN #Hx of HSV: speculum exam performed and clear without evidence of lesions  Judieth Keens, Medical Student 10:45 AM    Attestation of Supervision of Student:  I confirm that I have verified the information documented in the  resident  students note and that I have also personally reperformed the history, physical exam and all medical decision making activities.  I have verified that all services and findings are accurately documented in this student's note; and I agree with management and plan as outlined in the documentation. I have also made any necessary editorial changes.   Brand Males, CNM 02/07/21 1:26 PM

## 2021-02-07 NOTE — Discharge Summary (Signed)
Postpartum Discharge Summary  Date of Service updated     Patient Name: Tonya Douglas DOB: Jul 07, 1985 MRN: [id number]  Date of admission: 02/07/2021 Delivery date:02/07/2021  Delivering provider: Renee Harder  Date of discharge: 02/08/2021  Admitting diagnosis: GDM, class A2 [O24.419] Intrauterine pregnancy: [redacted]w[redacted]d    Secondary diagnosis:  Principal Problem:   GDM, class A2 Active Problems:   SVD (spontaneous vaginal delivery)  Additional problems: None   Discharge diagnosis: Term Pregnancy Delivered                                              Post partum procedures: None Augmentation: Pitocin, Cytotec, and IP Foley Complications: None  Hospital course: Induction of Labor With Vaginal Delivery   36y.o. yo G2P1001 at 315w2das admitted to the hospital 02/07/2021 for induction of labor.  Indication for induction: A2 DM.  Patient had an uncomplicated labor course as follows: Membrane Rupture Time/Date: 2:11 PM ,02/07/2021   Delivery Method:Vaginal, Spontaneous  Episiotomy: None  Lacerations:  None  Details of delivery can be found in separate delivery note.  Patient had a routine postpartum course. Patient is discharged home 02/08/21.  Newborn Data: Birth date:02/07/2021  Birth time:2:12 PM  Gender:Female  Living status:Living  Apgars:7 ,9  Weight:2930 g   Magnesium Sulfate received: No BMZ received: No Rhophylac:No MMR:N/A T-DaP:Given prenatally Flu: No Transfusion:No  Physical exam  Vitals:   02/07/21 1720 02/07/21 2130 02/08/21 0123 02/08/21 0536  BP: (!) 107/51 123/61 124/64 122/60  Pulse: 79 81 78 80  Resp: _0 Temp: 98.1 F (36.7 C) 98.5 F (36.9 C) 98.7 F (37.1 C) 98.6 F (37 C)  TempSrc:  Oral Oral Oral  SpO2: 99% 100% 100% 100%   General: alert, cooperative, and no distress Lochia: appropriate Uterine Fundus: firm Incision: N/A DVT Evaluation: No evidence of DVT seen on physical exam. Labs: Lab Results  Component Value Date    WBC 9.3 02/07/2021   HGB 11.7 (L) 02/07/2021   HCT 34.4 (L) 02/07/2021   MCV 87.8 02/07/2021   PLT 211 02/07/2021   CMP Latest Ref Rng & Units 07/20/2020  Glucose 65 - 99 mg/dL 88  BUN 6 - 20 mg/dL 10  Creatinine 0.57 - 1.00 mg/dL 0.60  Sodium 134 - 144 mmol/L 139  Potassium 3.5 - 5.2 mmol/L 3.9  Chloride 96 - 106 mmol/L 105  CO2 20 - 29 mmol/L 17(L)  Calcium 8.7 - 10.2 mg/dL 9.2  Total Protein 6.0 - 8.5 g/dL 6.9  Total Bilirubin 0.0 - 1.2 mg/dL <0.2  Alkaline Phos 44 - 121 IU/L 56  AST 0 - 40 IU/L 15  ALT 0 - 32 IU/L 12   Edinburgh Score: Edinburgh Postnatal Depression Scale Screening Tool 02/08/2021  I have been able to laugh and see the funny side of things. 0  I have looked forward with enjoyment to things. 0  I have blamed myself unnecessarily when things went wrong. 0  I have been anxious or worried for no good reason. 0  I have felt scared or panicky for no good reason. 0  Things have been getting on top of me. 0  I have been so unhappy that I have had difficulty sleeping. 0  I have felt sad or miserable. 0  I have been so unhappy that I have been  0  °The thought of harming myself has occurred to me. 0  °Edinburgh Postnatal Depression Scale Total 0  ° ° ° °After visit meds:  °Allergies as of 02/08/2021   °No Known Allergies °  ° °  °Medication List  °  ° °STOP taking these medications   ° °Accu-Chek Aviva Plus test strip °Generic drug: glucose blood °  °Accu-Chek Softclix Lancets lancets °  °metFORMIN 500 MG tablet °Commonly known as: GLUCOPHAGE °  ° °  ° °TAKE these medications   ° °measles, mumps & rubella vaccine injection °Commonly known as: MMR °Inject 0.5 mLs into the skin once for 1 dose. °  °Omega-3 1000 MG Caps °Take by mouth. °  °PRE-NATAL FORMULA PO °Take by mouth. °  °Probiotic-10 Caps °Take by mouth. °  °simethicone 80 MG chewable tablet °Commonly known as: MYLICON °Chew 1 tablet (80 mg total) by mouth as needed for flatulence. °  °Tdap 5-2.5-18.5 LF-MCG/0.5  injection °Commonly known as: BOOSTRIX °Inject 0.5 mLs into the muscle once for 1 dose. °  °valACYclovir 500 MG tablet °Commonly known as: Valtrex °Take 1 tablet (500 mg total) by mouth 2 (two) times daily. °  °witch hazel-glycerin pad °Commonly known as: TUCKS °Apply 1 application topically as needed for hemorrhoids. °  ° °  ° ° ° °Discharge home in stable condition °Infant Feeding: Breast °Infant Disposition:home with mother °Discharge instruction: per After Visit Summary and Postpartum booklet. °Activity: Advance as tolerated. Pelvic rest for 6 weeks.  °Diet: routine diet °Future Appointments: °Future Appointments  °Date Time Provider Department Center  °03/22/2021  8:35 AM Stinson, Jacob J, DO [facility] None  ° °Follow up Visit: ° ° °Please schedule this patient for a In person postpartum visit in 4 weeks with the following provider: Any provider. °Additional Postpartum F/U:2 hour GTT  °Low risk pregnancy complicated by:  GDMA2 °Delivery mode:  Vaginal, Spontaneous  °Anticipated Birth Control:  OCPs ° ° °02/08/2021 °Kathryn Lorraine Kooistra, CNM ° ° ° °

## 2021-02-07 NOTE — H&P (Signed)
OBSTETRIC ADMISSION HISTORY AND PHYSICAL  Tonya Douglas is a 36 y.o. female G2P1001 with IUP at 58w2dby LMP presenting for IOL due to A2GDM. She reports +FMs, No LOF, no VB, no blurry vision, headaches or peripheral edema, and RUQ pain.  She plans on breast feeding. She request POPs for birth control. She received her prenatal care at  [facility]    Dating: By LMP --->  Estimated Date of Delivery: 02/12/21  Sono:    @[redacted]w[redacted]d , CWD, normal anatomy, cephalic presentation, [id number] 47% EFW   Prenatal History/Complications:  --GDM on metformin  --Rubella non-immune  --History of HSV on valtrex since 36 weeks  --GBS positive   Past Medical History: Past Medical History:  Diagnosis Date   Abnormal Pap smear 07/04/2009   Asc-us + HPV, COLPO 07/2009 CIN 1   Abnormal Pap smear 02/2010   LSIL, COLPO Neg   STD (sexually transmitted disease) 06/2009   + chlam    STD (sexually transmitted disease) 03/29/2011   + HSV II proven C&S vulva   STD (sexually transmitted disease) 04/11/2011   + RPR titer, confirmation test neg do T.pallidium test (lab [id number]   Vaginal Pap smear, abnormal     Past Surgical History: Past Surgical History:  Procedure Laterality Date   COLPOSCOPY  07/2009   CIN I    Obstetrical History: OB History     Gravida  2   Para  1   Term  1   Preterm  0   AB  0   Living  1      SAB  0   IAB  0   Ectopic  0   Multiple  0   Live Births  1           Social History Social History   Socioeconomic History   Marital status: Married    Spouse name: Not on file   Number of children: Not on file   Years of education: Not on file   Highest education level: Not on file  Occupational History   Not on file  Tobacco Use   Smoking status: Never   Smokeless tobacco: Never  Vaping Use   Vaping Use: Never used  Substance and Sexual Activity   Alcohol use: Not Currently    Alcohol/week: 2.0 standard drinks    Types: 2 Standard drinks or equivalent per week    Drug use: No   Sexual activity: Yes    Partners: Male  Other Topics Concern   Not on file  Social History Narrative   Not on file   Social Determinants of Health   Financial Resource Strain: Not on file  Food Insecurity: Not on file  Transportation Needs: Not on file  Physical Activity: Not on file  Stress: Not on file  Social Connections: Not on file    Family History: Family History  Problem Relation Age of Onset   Hypertension Father     Allergies: No Known Allergies  Medications Prior to Admission  Medication Sig Dispense Refill Last Dose   metFORMIN (GLUCOPHAGE) 500 MG tablet Take 1 tablet (500 mg total) by mouth daily with supper. 90 tablet 1 02/06/2021   Omega-3 1000 MG CAPS Take by mouth.   02/06/2021   Prenatal Multivit-Min-Fe-FA (PRE-NATAL FORMULA PO) Take by mouth.   02/06/2021   Probiotic Product (PROBIOTIC-10) CAPS Take by mouth.   02/06/2021   valACYclovir (VALTREX) 500 MG tablet Take 1 tablet (500 mg total) by mouth 2 (two) times daily.  60 tablet 5 02/06/2021   Accu-Chek Softclix Lancets lancets 1 each by Other route 4 (four) times daily. 150 each 3    glucose blood (ACCU-CHEK AVIVA PLUS) test strip Use to check blood sugars four times a day. 360 each 3      Review of Systems   All systems reviewed and negative except as stated in HPI  Blood pressure (!) 109/49, pulse 86, temperature 98 F (36.7 C), resp. rate 18, last menstrual period 05/08/2020, currently breastfeeding. General appearance: alert, cooperative, and no distress Lungs: normal WOB  Heart: regular rate and rhythm Abdomen: soft, non-tender Pelvic: NEFG  Extremities: Homans sign is negative, no sign of DVT Presentation: cephalic Fetal monitoringBaseline: 135 bpm, Variability: Good {> 6 bpm), Accelerations: Reactive, and Decelerations: Absent Uterine activityNone Dilation: 2 Effacement (%): 60 Station: -2 Exam by:: West Pugh, RN   Prenatal labs: ABO, Rh: --/--/O POS (01/03  0059) Antibody: NEG (01/03 0059) Rubella: <0.90 (06/15 1409) RPR: Non Reactive (10/19 0908)  HBsAg: Negative (06/15 1409)  HIV: Non Reactive (10/19 0908)  GBS: Positive/-- (12/14 0916)  2 hr Glucola failed  Genetic screening  LR NIPS Anatomy US normal (marginal cord present that has since resolved)   Prenatal Transfer Tool  Maternal Diabetes: Yes:  Diabetes Type:  Insulin/Medication controlled Genetic Screening: Normal Maternal Ultrasounds/Referrals: Normal Fetal Ultrasounds or other Referrals:  None Maternal Substance Abuse:  No Significant Maternal Medications:  None Significant Maternal Lab Results: Group B Strep positive  Results for orders placed or performed during the hospital encounter of 02/07/21 (from the past 24 hour(s))  CBC   Collection Time: 02/07/21 12:37 AM  Result Value Ref Range   WBC 9.3 4.0 - 10.5 K/uL   RBC 3.92 3.87 - 5.11 MIL/uL   Hemoglobin 11.7 (L) 12.0 - 15.0 g/dL   HCT 34.4 (L) 36.0 - 46.0 %   MCV 87.8 80.0 - 100.0 fL   MCH 29.8 26.0 - 34.0 pg   MCHC 34.0 30.0 - 36.0 g/dL   RDW 16.0 (H) 11.5 - 15.5 %   Platelets 211 150 - 400 K/uL   nRBC 0.0 0.0 - 0.2 %  Type and screen [place]   Collection Time: 02/07/21 12:59 AM  Result Value Ref Range   ABO/RH(D) O POS    Antibody Screen NEG    Sample Expiration      02/10/2021,2359 Performed at [facility], [place] [address], [place], [place] [address]   Resp Panel by RT-PCR (Flu A&B, Covid) Nasopharyngeal Swab   Collection Time: 02/07/21  1:32 AM   Specimen: Nasopharyngeal Swab; Nasopharyngeal(NP) swabs in vial transport medium  Result Value Ref Range   SARS Coronavirus 2 by RT PCR NEGATIVE NEGATIVE   Influenza A by PCR NEGATIVE NEGATIVE   Influenza B by PCR NEGATIVE NEGATIVE  Glucose, capillary   Collection Time: 02/07/21  1:53 AM  Result Value Ref Range   Glucose-Capillary 113 (H) 70 - 99 mg/dL    Patient Active Problem List   Diagnosis Date Noted   GDM, class A2  02/07/2021   GBS (group B Streptococcus carrier), +RV culture, currently pregnant 01/21/2021   Genital herpes affecting pregnancy, antepartum 11/23/2020   Rubella non-immune status, antepartum 08/17/2020   Supervision of high risk pregnancy, antepartum 07/20/2020   History of gestational diabetes mellitus (GDM) 07/20/2020   Gestational diabetes mellitus (GDM) in third trimester 02/10/2018    Assessment/Plan:  Tonya Douglas is a 36 y.o. G2P1001 at 52w2dhere for IOL  due to A2GDM.   #Labor: Cytotec given and plan to return for FB placement.  #Pain: PRN #FWB: Cat 1  #ID: Gbs positive > PCN  #MOF: Breastfeeding  #MOC: POPs #Circ: NA (female)   #A2GDM: CBG 113 on admit. Metformin nightly at home with good control per patient. EFW 47% at 36w. Monitor CBG q 4.   #Rubella non-immune: MMR pp.   #History of HSV: no current outbreak per patient, will return for speculum exam. She has been on prophylactic valtrex.   Patriciaann Clan, DO  02/07/2021, 5:20 AM

## 2021-02-07 NOTE — Progress Notes (Signed)
Labor Progress Note Tonya Douglas is a 36 y.o. G2P1001 at [redacted]w[redacted]d presented for IOL d/t A2GDM.   S: Doing well, no concerns.   O:  BP (!) 109/49    Pulse 86    Temp 98 F (36.7 C)    Resp 18    LMP 05/08/2020  EFM: 135/mod/15x15/none  CVE: Dilation: 2 Effacement (%): 60 Station: -2 Presentation: Vertex Exam by:: Dr. Higinio Plan   A&P: 36 y.o. G2P1001 [redacted]w[redacted]d  #Labor: Minimal progression since last check. After verbal consent, placed FB. Patient tolerated well. Placed second vaginal cytotec as well.  #Pain: PRN  #FWB: Cat 1  #GBS positive PCN  #History of HSV: Speculum exam performed and clear without evidence of lesions.   Patriciaann Clan, DO 6:07 AM

## 2021-02-08 MED ORDER — SIMETHICONE 80 MG PO CHEW: 80.0000 mg | CHEWABLE_TABLET | ORAL | 0 refills | Status: DC | PRN

## 2021-02-08 MED ORDER — WITCH HAZEL-GLYCERIN EX PADS: 1.0000 "application " | MEDICATED_PAD | CUTANEOUS | 12 refills | Status: DC | PRN

## 2021-02-08 MED ORDER — MEASLES, MUMPS & RUBELLA VAC IJ SOLR
0.5000 mL | Freq: Once | INTRAMUSCULAR | 0 refills | Status: AC
Start: 2021-02-08 — End: 2021-02-08

## 2021-02-08 MED ORDER — TETANUS-DIPHTH-ACELL PERTUSSIS 5-2.5-18.5 LF-MCG/0.5 IM SUSY: 0.5000 mL | PREFILLED_SYRINGE | Freq: Once | INTRAMUSCULAR | 0 refills | Status: AC

## 2021-02-08 NOTE — Progress Notes (Addendum)
POSTPARTUM PROGRESS NOTE  Post Partum Day 1  Subjective:  Tonya Douglas is a 36 y.o. [id number] s/p SVD at [redacted]w[redacted]d. She had IOL for A2GDM.  She reports she is doing well. No acute events overnight. She denies any problems with ambulating, voiding or po intake. Denies nausea or vomiting.  Pain is well controlled.  Lochia is improving without large clots.  Objective: Blood pressure 122/60, pulse 80, temperature 98.6 F (37 C), temperature source Oral, resp. rate 16, last menstrual period 05/08/2020, SpO2 100 %, unknown if currently breastfeeding.  Physical Exam:  General: alert, cooperative and no distress Chest: no respiratory distress Heart:regular rate, distal pulses intact Abdomen: soft, nontender,  Uterine Fundus: firm, appropriately tender DVT Evaluation: No calf swelling or tenderness Extremities: No LE edema Skin: warm, dry  Recent Labs    02/07/21 0037  HGB 11.7*  HCT 34.4*    Assessment/Plan: Tonya Douglas is a 36 y.o. [id number] s/p SVD at [redacted]w[redacted]d   PPD#1 - Doing well  Routine postpartum care  Contraception: Progestin oral contraceptives Feeding: Breast Dispo: Plan for discharge later today or tomorrow.   LOS: 1 day   Marcelline Mates, MS3 02/08/2021, 9:08 AM   Attestation of Supervision of Student:  I confirm that I have verified the information documented in the physician assistant students note and that I have also personally reperformed the history, physical exam and all medical decision making activities.  I have verified that all services and findings are accurately documented in this student's note; and I agree with management and plan as outlined in the documentation. I have also made any necessary editorial changes.   Marylene Land, CNM [facility] [employer], [facility] 02/08/2021 9:36 AM

## 2021-02-21 ENCOUNTER — Telehealth (HOSPITAL_COMMUNITY): Payer: Self-pay | Admitting: *Deleted

## 2021-02-21 NOTE — Telephone Encounter (Signed)
Attempted Hospital Discharge Follow-Up Call.  Left voice mail requesting that patient return RN's phone call.  

## 2021-03-15 ENCOUNTER — Ambulatory Visit: Payer: 59 | Admitting: Family Medicine

## 2021-03-22 ENCOUNTER — Other Ambulatory Visit: Payer: Self-pay

## 2021-03-22 ENCOUNTER — Ambulatory Visit (INDEPENDENT_AMBULATORY_CARE_PROVIDER_SITE_OTHER): Payer: 59 | Admitting: Family Medicine

## 2021-03-22 DIAGNOSIS — O24415 Gestational diabetes mellitus in pregnancy, controlled by oral hypoglycemic drugs: Secondary | ICD-10-CM

## 2021-03-22 MED ORDER — NORETHINDRONE 0.35 MG PO TABS: 1.0000 | ORAL_TABLET | Freq: Every day | ORAL | 3 refills | Status: DC

## 2021-03-22 NOTE — Progress Notes (Signed)
Post Partum Visit Note  Tonya Douglas is a 36 y.o. G79P2002 female who presents for a postpartum visit. She is 6 weeks postpartum following a normal spontaneous vaginal delivery.  I have fully reviewed the prenatal and intrapartum course. The delivery was at 39.2 gestational weeks.  Anesthesia: none. Postpartum course has been uneventful. Baby is doing well. Baby is feeding by breast. Bleeding no bleeding. Bowel function is normal. Bladder function is normal. Patient is not sexually active. Contraception method is oral progesterone-only contraceptive. Postpartum depression screening: negative.   The pregnancy intention screening data noted above was reviewed. Potential methods of contraception were discussed. The patient elected to proceed with No data recorded.   Edinburgh Postnatal Depression Scale - 03/22/21 0836       Edinburgh Postnatal Depression Scale:  In the Past 7 Days   I have been able to laugh and see the funny side of things. 0    I have looked forward with enjoyment to things. 0    I have blamed myself unnecessarily when things went wrong. 0    I have been anxious or worried for no good reason. 0    I have felt scared or panicky for no good reason. 0    Things have been getting on top of me. 0    I have been so unhappy that I have had difficulty sleeping. 0    I have felt sad or miserable. 0    I have been so unhappy that I have been crying. 0    The thought of harming myself has occurred to me. 0    Edinburgh Postnatal Depression Scale Total 0             Health Maintenance Due  Topic Date Due   COVID-19 Vaccine (1) Never done   URINE MICROALBUMIN  Never done   INFLUENZA VACCINE  Never done    The following portions of the patient's history were reviewed and updated as appropriate: allergies, current medications, past family history, past medical history, past social history, past surgical history, and problem list.  Review of Systems Pertinent items are  noted in HPI.  Objective:  BP 121/76    Pulse 68    Wt 167 lb (75.8 kg)    LMP 05/08/2020    Breastfeeding Yes    BMI 28.67 kg/m    General:  alert, cooperative, and no distress   Breasts:  not indicated  Lungs: clear to auscultation bilaterally  Heart:  regular rate and rhythm, S1, S2 normal, no murmur, click, rub or gallop  Abdomen: soft, non-tender; bowel sounds normal; no masses,  no organomegaly   Wound N/a  GU exam:  not indicated       Assessment:     1. Postpartum exam   2. Gestational diabetes mellitus (GDM) in third trimester controlled on oral hypoglycemic drug      Plan:   Essential components of care per ACOG recommendations:  1.  Mood and well being: Patient with negative depression screening today. Reviewed local resources for support.  - Patient tobacco use? No.   - hx of drug use? No.    2. Infant care and feeding:  -Patient currently breastmilk feeding? Yes. Reviewed importance of draining breast regularly to support lactation.  -Social determinants of health (SDOH) reviewed in EPIC. No concerns  3. Sexuality, contraception and birth spacing - Patient does not want a pregnancy in the next year.   - Reviewed forms of contraception in  tiered fashion. Patient desired condoms, oral progesterone-only contraceptive today.   - Discussed birth spacing of 18 months  4. Sleep and fatigue -Encouraged family/partner/community support of 4 hrs of uninterrupted sleep to help with mood and fatigue  5. Physical Recovery  - Discussed patients delivery and complications. She describes her labor as good. - Patient had a Vaginal, no problems at delivery.  - Patient has urinary incontinence? No. - Patient is safe to resume physical and sexual activity  6.  Health Maintenance - HM due items addressed Yes - Last pap smear  Diagnosis  Date Value Ref Range Status  08/13/2019   Final   - Negative for intraepithelial lesion or malignancy (NILM)   Pap smear not done at  today's visit.  -Breast Cancer screening indicated? No.   7. Chronic Disease/Pregnancy Condition follow up: Gestational Diabetes  - PCP follow up  Levie Heritage, DO [facility], [facility]

## 2021-03-23 ENCOUNTER — Other Ambulatory Visit: Payer: 59

## 2021-03-23 NOTE — Progress Notes (Signed)
Patient sent to lab for 2 hr gtt postpartum. Armandina Stammer RN

## 2021-03-24 LAB — GLUCOSE TOLERANCE, 2 HOURS
Glucose, 2 hour: 110 mg/dL (ref 70–139)
Glucose, GTT - Fasting: 66 mg/dL — ABNORMAL LOW (ref 70–99)

## 2021-03-28 ENCOUNTER — Other Ambulatory Visit: Payer: 59

## 2021-07-25 ENCOUNTER — Telehealth: Payer: Self-pay | Admitting: General Practice

## 2021-07-25 NOTE — Telephone Encounter (Signed)
Left message on VM for pt to contact our office to schedule 6 month f/u in August with Dr. Adrian Blackwater.

## 2021-10-25 DIAGNOSIS — M67431 Ganglion, right wrist: Secondary | ICD-10-CM | POA: Diagnosis not present

## 2021-12-01 DIAGNOSIS — M6740 Ganglion, unspecified site: Secondary | ICD-10-CM | POA: Diagnosis not present

## 2021-12-01 DIAGNOSIS — M67431 Ganglion, right wrist: Secondary | ICD-10-CM | POA: Diagnosis not present

## 2021-12-04 DIAGNOSIS — L700 Acne vulgaris: Secondary | ICD-10-CM | POA: Diagnosis not present

## 2021-12-04 DIAGNOSIS — E663 Overweight: Secondary | ICD-10-CM | POA: Diagnosis not present

## 2022-01-02 DIAGNOSIS — M67431 Ganglion, right wrist: Secondary | ICD-10-CM | POA: Diagnosis not present

## 2022-01-16 DIAGNOSIS — H04123 Dry eye syndrome of bilateral lacrimal glands: Secondary | ICD-10-CM | POA: Diagnosis not present

## 2022-01-16 DIAGNOSIS — D23121 Other benign neoplasm of skin of left upper eyelid, including canthus: Secondary | ICD-10-CM | POA: Diagnosis not present

## 2022-03-12 DIAGNOSIS — H04203 Unspecified epiphora, bilateral lacrimal glands: Secondary | ICD-10-CM | POA: Diagnosis not present

## 2022-03-12 DIAGNOSIS — L989 Disorder of the skin and subcutaneous tissue, unspecified: Secondary | ICD-10-CM | POA: Diagnosis not present

## 2022-05-02 DIAGNOSIS — J020 Streptococcal pharyngitis: Secondary | ICD-10-CM | POA: Diagnosis not present

## 2022-05-02 DIAGNOSIS — Z20822 Contact with and (suspected) exposure to covid-19: Secondary | ICD-10-CM | POA: Diagnosis not present

## 2022-05-02 DIAGNOSIS — J029 Acute pharyngitis, unspecified: Secondary | ICD-10-CM | POA: Diagnosis not present

## 2022-05-29 DIAGNOSIS — H02409 Unspecified ptosis of unspecified eyelid: Secondary | ICD-10-CM | POA: Diagnosis not present

## 2022-09-26 ENCOUNTER — Ambulatory Visit: Payer: 59 | Admitting: Family Medicine

## 2022-10-04 ENCOUNTER — Ambulatory Visit (INDEPENDENT_AMBULATORY_CARE_PROVIDER_SITE_OTHER): Payer: BC Managed Care – PPO | Admitting: Family Medicine

## 2022-10-04 ENCOUNTER — Encounter: Payer: Self-pay | Admitting: Family Medicine

## 2022-10-04 ENCOUNTER — Other Ambulatory Visit (HOSPITAL_COMMUNITY)
Admission: RE | Admit: 2022-10-04 | Discharge: 2022-10-04 | Disposition: A | Discharge status: Home / Self Care | Payer: BC Managed Care – PPO | Source: Ambulatory Visit | Attending: Family Medicine | Admitting: Family Medicine

## 2022-10-04 VITALS — BP 115/62 | HR 90 | Ht 64.0 in | Wt 167.0 lb

## 2022-10-04 DIAGNOSIS — K644 Residual hemorrhoidal skin tags: Secondary | ICD-10-CM

## 2022-10-04 DIAGNOSIS — L910 Hypertrophic scar: Secondary | ICD-10-CM

## 2022-10-04 DIAGNOSIS — Z01419 Encounter for gynecological examination (general) (routine) without abnormal findings: Secondary | ICD-10-CM | POA: Insufficient documentation

## 2022-10-04 DIAGNOSIS — Z1339 Encounter for screening examination for other mental health and behavioral disorders: Secondary | ICD-10-CM

## 2022-10-04 DIAGNOSIS — O98319 Other infections with a predominantly sexual mode of transmission complicating pregnancy, unspecified trimester: Secondary | ICD-10-CM | POA: Diagnosis not present

## 2022-10-04 DIAGNOSIS — A6009 Herpesviral infection of other urogenital tract: Secondary | ICD-10-CM

## 2022-10-04 MED ORDER — VALACYCLOVIR HCL 500 MG PO TABS: 500.0000 mg | ORAL_TABLET | Freq: Every day | ORAL | 3 refills | Status: AC

## 2022-10-04 NOTE — Progress Notes (Signed)
ANNUAL EXAM Patient name: Tonya Douglas MRN [id number]  Date of birth: 06-Feb-1986 Chief Complaint:   Annual Exam  History of Present Illness:   Tonya Douglas is a 37 y.o.  G50P2002  female  being seen today for a routine annual exam.  Current complaints: has some itching and discomfort at skin around anus. She does have an area on anus that is recurrently irritated (once every 2-3 months), but this is separate. Does wear athletic clothing. Used an antifungal cream and it seems to be better now.  Patient's last menstrual period was 09/25/2022.    Last pap 2021. Results were: NILM w/ HRHPV negative. H/O abnormal pap: no Last mammogram: n/a.     10/04/2022   10:26 AM 01/18/2021    8:48 AM 11/23/2020    8:41 AM 07/20/2020    1:18 PM 12/04/2017    5:45 PM  Depression screen PHQ 2/9  Decreased Interest 0 0 0 0 0  Down, Depressed, Hopeless 0 0 0 0 0  PHQ - 2 Score 0 0 0 0 0  Altered sleeping 0 0 0 0   Tired, decreased energy 1 1 1 1    Change in appetite 0 0 0 0   Feeling bad or failure about yourself  0 0 0 0   Trouble concentrating 0 0 0 0   Moving slowly or fidgety/restless 0 0 0 0   Suicidal thoughts 0 0 0 0   PHQ-9 Score 1 1 1 1    Difficult doing work/chores    Not difficult at all         10/04/2022   10:26 AM 01/18/2021    8:48 AM 11/23/2020    8:42 AM 07/20/2020    1:19 PM  GAD 7 : Generalized Anxiety Score  Nervous, Anxious, on Edge 0 0 0 0  Control/stop worrying 0 1 0 0  Worry too much - different things 0 0 0 1  Trouble relaxing 0 0 0 0  Restless 0 0 0 0  Easily annoyed or irritable 0 0 0 1  Afraid - awful might happen 0 0 0 0  Total GAD 7 Score 0 1 0 2  Anxiety Difficulty    Not difficult at all     Review of Systems:   Pertinent items are noted in HPI Denies any headaches, blurred vision, fatigue, shortness of breath, chest pain, abdominal pain, abnormal vaginal discharge/itching/odor/irritation, problems with periods, bowel movements, urination, or  intercourse unless otherwise stated above. Pertinent History Reviewed:  Reviewed past medical,surgical, social and family history.  Reviewed problem list, medications and allergies. Physical Assessment:   Vitals:   10/04/22 1020  BP: 115/62  Pulse: 90  Weight: 167 lb (75.8 kg)  Height: 5\' 4"  (1.626 m)  Body mass index is 28.67 kg/m.        Physical Examination:   General appearance - well appearing, and in no distress  Mental status - alert, oriented to person, place, and time  Psych:  She has a normal mood and affect  Skin - warm and dry, normal color, no suspicious lesions noted. Keloid on right wrist from surgery.   Chest - effort normal, all lung fields clear to auscultation bilaterally  Heart - normal rate and regular rhythm  Neck:  midline trachea, no thyromegaly or nodules  Breasts - breasts appear normal, no suspicious masses, no skin or nipple changes or axillary nodes  Abdomen - soft, nontender, nondistended, no masses or organomegaly  Pelvic - VULVA: normal  appearing vulva with no masses, tenderness or lesions  VAGINA: normal appearing vagina with normal color and discharge, no lesions  CERVIX: normal appearing cervix without discharge or lesions, no CMT  Thin prep pap is done with HR HPV cotesting  UTERUS: uterus is felt to be normal size, shape, consistency and nontender   ADNEXA: No adnexal masses or tenderness noted. Anus: small external hemorrhoid. No irritation to the skin surrounding anus  Extremities:  No swelling or varicosities noted  Chaperone present for exam  Assessment & Plan:  1. Well woman exam with routine gynecological exam - Cytology - PAP( [place]) - Cervicovaginal ancillary only( [place]) - TSH - CBC - Comp Met (CMET) - Lipid panel  2. Keloid scar of skin Sometimes bothers.   3. Genital herpes affecting pregnancy, antepartum Valtrex for suppression  4. External hemorrhoid This doesn't appear to be the source of her irritation.  All of the skin surrounding anus appears normal. Continue antifungal cream for 1 week, then stop. Preparation H for symptoms.   Labs/procedures today: n/a  Orders Placed This Encounter  Procedures   TSH   CBC   Comp Met (CMET)   Lipid panel    Meds: No orders of the defined types were placed in this encounter.   Follow-up: No follow-ups on file.  Levie Heritage, DO 10/04/2022 12:18 PM

## 2022-10-05 LAB — COMPREHENSIVE METABOLIC PANEL
ALT: 19 IU/L (ref 0–32)
AST: 27 IU/L (ref 0–40)
Albumin: 4.7 g/dL (ref 3.9–4.9)
Alkaline Phosphatase: 76 IU/L (ref 44–121)
BUN/Creatinine Ratio: 17 (ref 9–23)
BUN: 16 mg/dL (ref 6–20)
Bilirubin Total: 0.3 mg/dL (ref 0.0–1.2)
CO2: 18 mmol/L — ABNORMAL LOW (ref 20–29)
Calcium: 9.8 mg/dL (ref 8.7–10.2)
Chloride: 106 mmol/L (ref 96–106)
Creatinine, Ser: 0.96 mg/dL (ref 0.57–1.00)
Globulin, Total: 3.1 g/dL (ref 1.5–4.5)
Glucose: 87 mg/dL (ref 70–99)
Potassium: 4.7 mmol/L (ref 3.5–5.2)
Sodium: 143 mmol/L (ref 134–144)
Total Protein: 7.8 g/dL (ref 6.0–8.5)
eGFR: 80 mL/min/{1.73_m2} (ref >59)

## 2022-10-05 LAB — CBC
Hematocrit: 42.7 % (ref 34.0–46.6)
Hemoglobin: 12.8 g/dL (ref 11.1–15.9)
MCH: 26 pg — ABNORMAL LOW (ref 26.6–33.0)
MCHC: 30 g/dL — ABNORMAL LOW (ref 31.5–35.7)
MCV: 87 fL (ref 79–97)
Platelets: 344 10*3/uL (ref 150–450)
RBC: 4.93 x10E6/uL (ref 3.77–5.28)
RDW: 15.2 % (ref 11.7–15.4)
WBC: 8.5 10*3/uL (ref 3.4–10.8)

## 2022-10-05 LAB — LIPID PANEL
Chol/HDL Ratio: 2 ratio (ref 0.0–4.4)
Cholesterol, Total: 141 mg/dL (ref 100–199)
HDL: 71 mg/dL (ref >39)
LDL Chol Calc (NIH): 57 mg/dL (ref 0–99)
Triglycerides: 64 mg/dL (ref 0–149)
VLDL Cholesterol Cal: 13 mg/dL (ref 5–40)

## 2022-10-05 LAB — TSH: TSH: 0.668 u[IU]/mL (ref 0.450–4.500)

## 2022-10-11 LAB — CERVICOVAGINAL ANCILLARY ONLY
Bacterial Vaginitis (gardnerella): NEGATIVE
Candida Glabrata: NEGATIVE
Candida Vaginitis: NEGATIVE
Chlamydia: NEGATIVE
Comment: NEGATIVE
Comment: NEGATIVE
Comment: NEGATIVE
Comment: NEGATIVE
Comment: NEGATIVE
Comment: NORMAL
Neisseria Gonorrhea: NEGATIVE
Trichomonas: NEGATIVE

## 2022-10-11 LAB — CYTOLOGY - PAP
Comment: NEGATIVE
Diagnosis: NEGATIVE
High risk HPV: NEGATIVE

## 2023-12-23 IMAGING — US US FETAL BPP W/ NON-STRESS
1 series · 14 of 14 positions shown · non-contrast
Comparison: none

[Series 1: us fetal bpp w/ non-stress · 14 acquisitions, 14 frames shown]
[im 1/14]
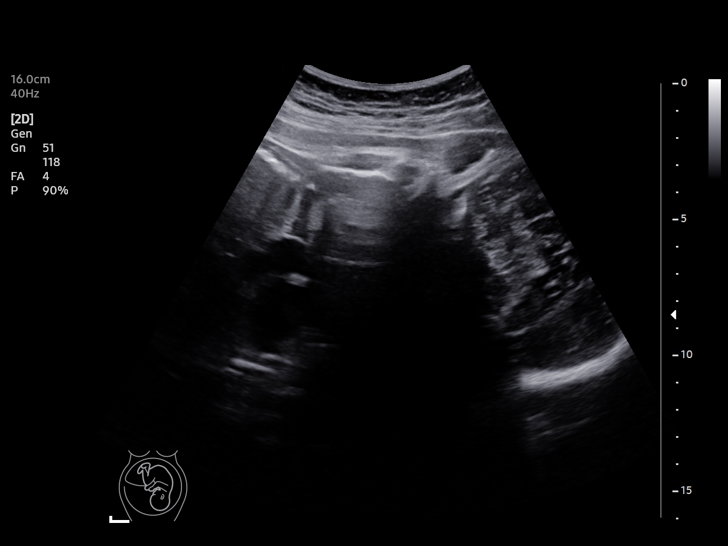
[im 2/14]
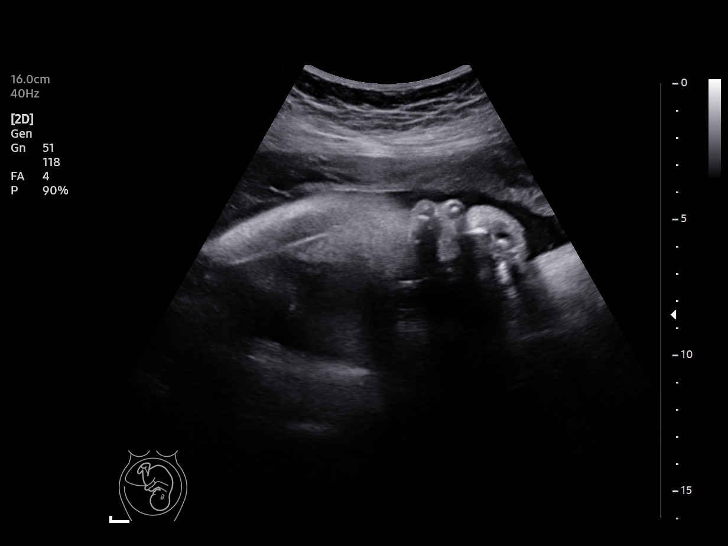
[im 3/14]
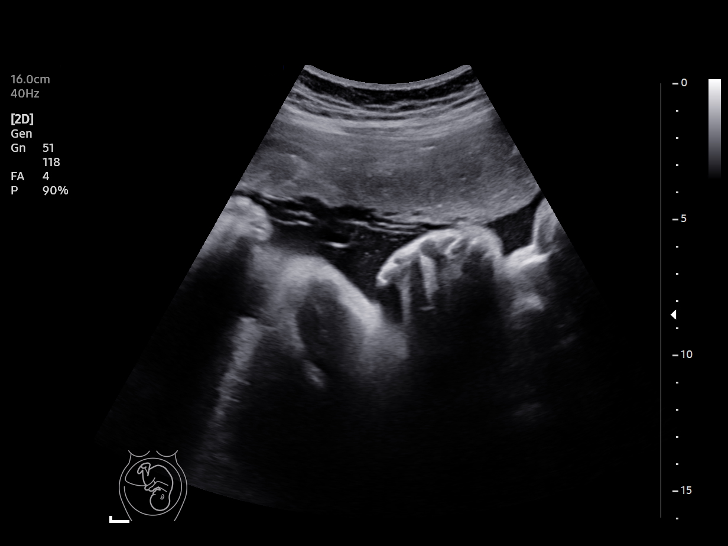
[im 4/14]
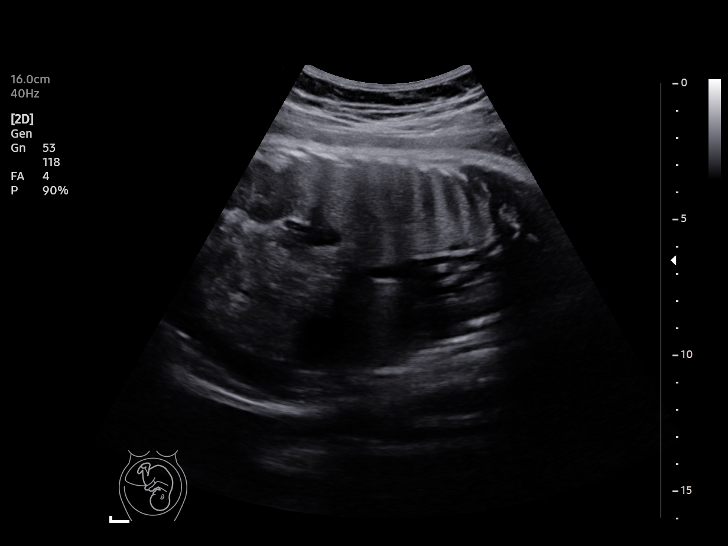
[im 5/14]
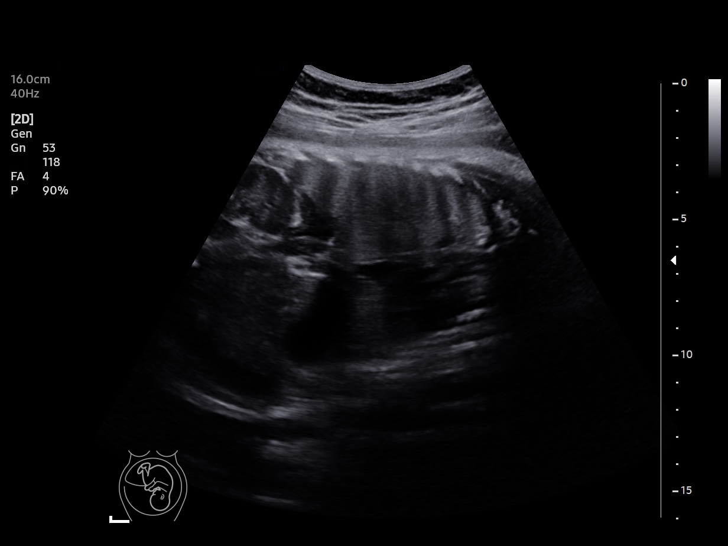
[im 6/14]
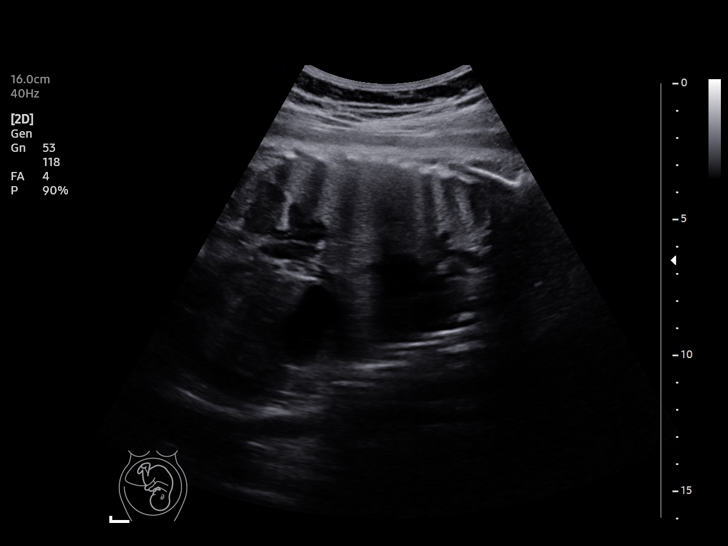
[im 7/14]
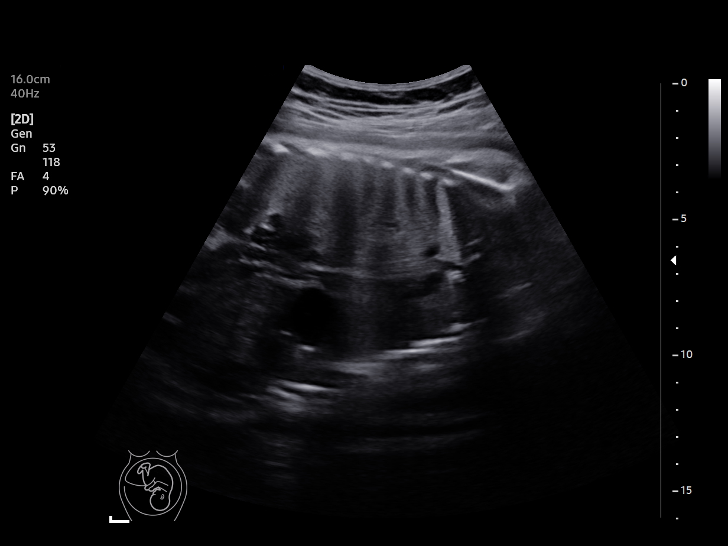
[im 8/14]
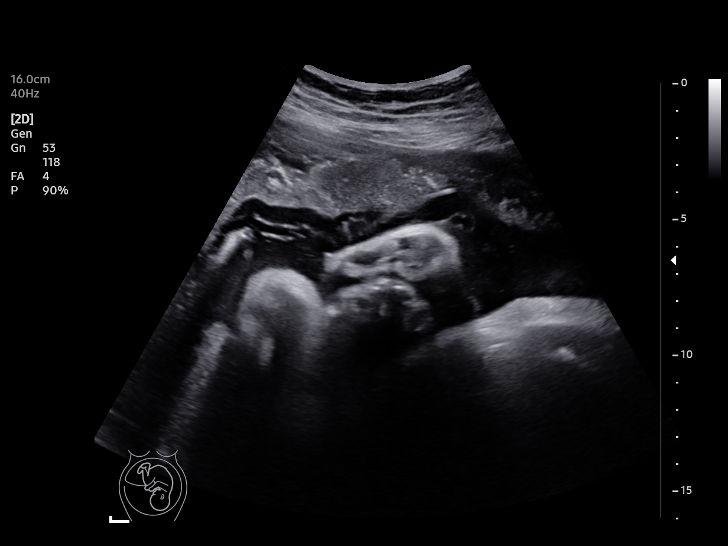
[im 9/14]
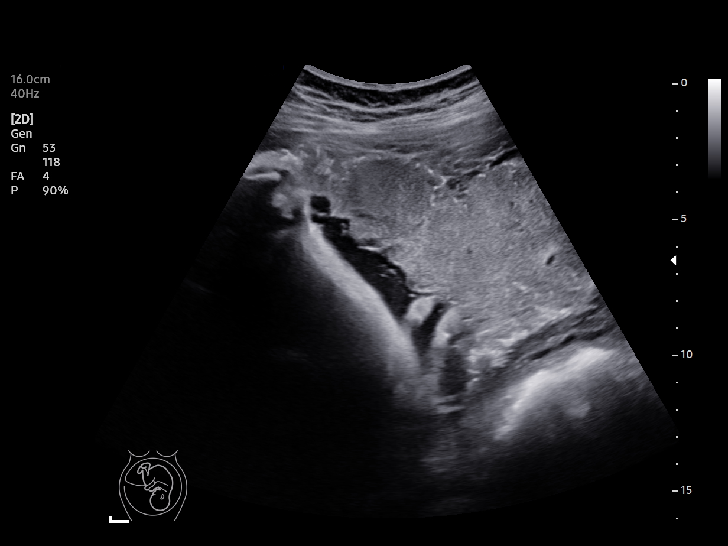
[im 10/14]
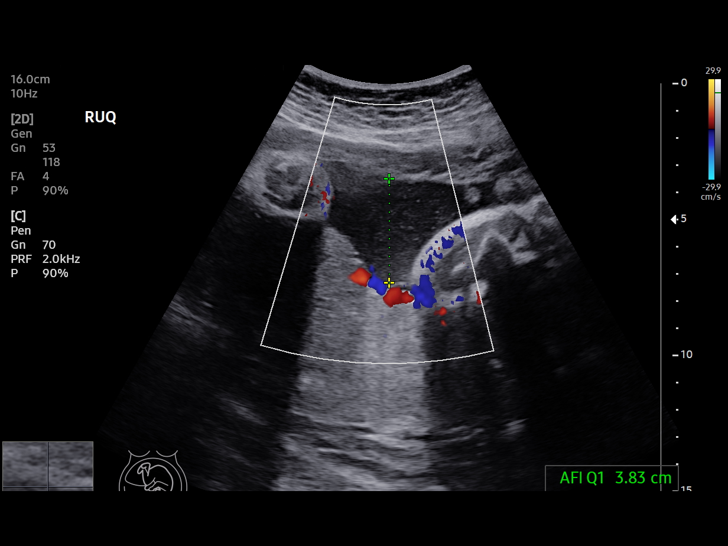
[im 11/14]
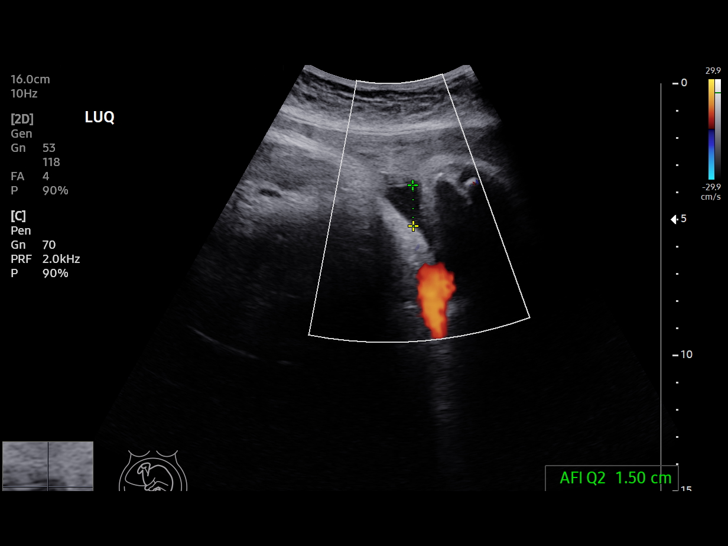
[im 12/14]
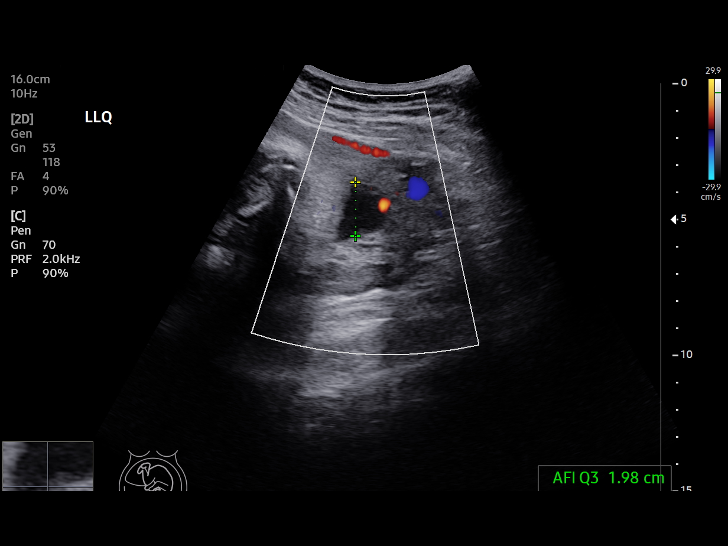
[im 13/14]
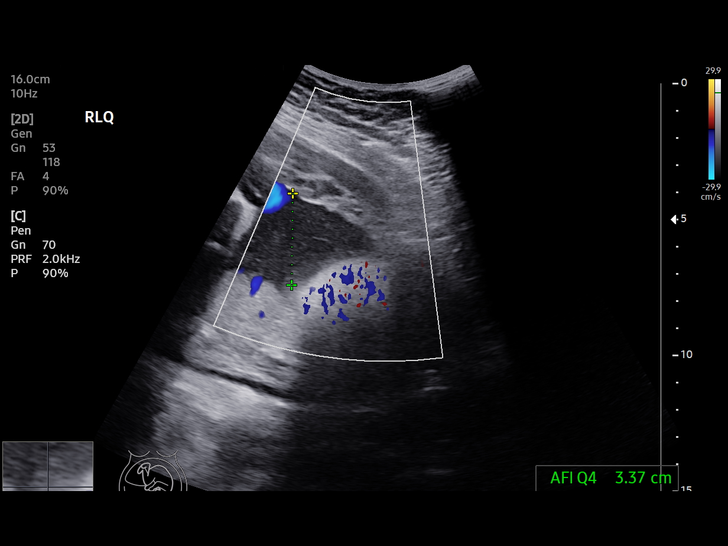
[im 14/14]
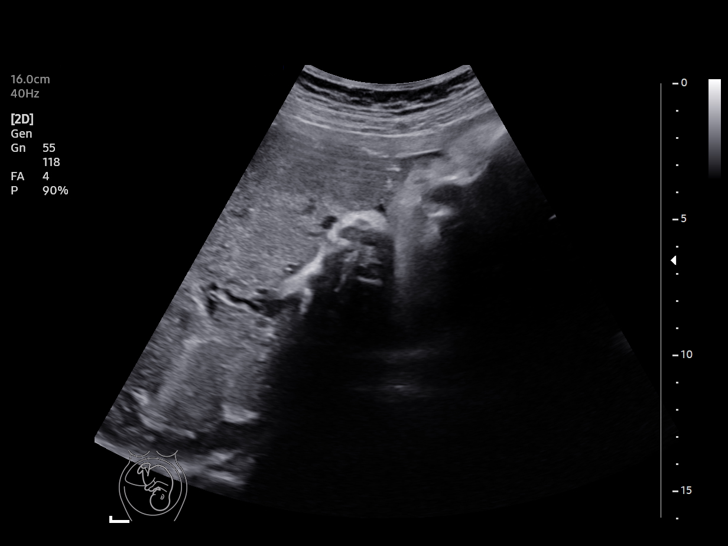

[14 of 14 positions shown; findings below may reference images not displayed]

[REDACTED]care at

Service(s) Provided

Indications

 38 weeks gestation of pregnancy
 Gestational diabetes in pregnancy,
 controlled by oral hypoglycemic drugs
Fetal Evaluation

 Num Of Fetuses:          1
 Preg. Location:          Intrauterine
 Cardiac Activity:        Observed
 Presentation:            Cephalic

 Amniotic Fluid
 AFI FV:      Within normal limits

 AFI Sum(cm)     %Tile       Largest Pocket(cm)
 10.68           32

 RUQ(cm)       RLQ(cm)       LUQ(cm)        LLQ(cm)

Biophysical Evaluation
 Amniotic F.V:   Pocket => 2 cm             F. Tone:         Observed
 F. Movement:    Observed                   N.S.T:           Reactive
 F. Breathing:   Observed                   Score:           [DATE]
OB History

 Gravidity:    2         Term:   1
 Living:       1
Gestational Age

 LMP:           38w 3d        Date:  05/08/20                 EDD:   02/12/21
 Best:          38w 3d     Det. By:  LMP  (05/08/20)          EDD:   02/12/21
Impression

                  Therese Schneider, DO
